# Patient Record
Sex: Female | Born: 1965
Health system: Southern US, Community
[De-identification: ages and names within clinical notes are randomized; demographics above are authoritative.]

## PROBLEM LIST (undated history)

## (undated) DIAGNOSIS — R896 Abnormal cytological findings in specimens from other organs, systems and tissues: Secondary | ICD-10-CM

## (undated) DIAGNOSIS — R011 Cardiac murmur, unspecified: Secondary | ICD-10-CM

## (undated) DIAGNOSIS — R51 Headache: Secondary | ICD-10-CM

## (undated) DIAGNOSIS — R519 Headache, unspecified: Secondary | ICD-10-CM

## (undated) DIAGNOSIS — M75100 Unspecified rotator cuff tear or rupture of unspecified shoulder, not specified as traumatic: Secondary | ICD-10-CM

## (undated) DIAGNOSIS — M1990 Unspecified osteoarthritis, unspecified site: Secondary | ICD-10-CM

## (undated) HISTORY — DX: Unspecified osteoarthritis, unspecified site: M19.90

## (undated) HISTORY — DX: Abnormal cytological findings in specimens from other organs, systems and tissues: R89.6

## (undated) HISTORY — DX: Unspecified rotator cuff tear or rupture of unspecified shoulder, not specified as traumatic: M75.100

## (undated) HISTORY — PX: TONSILLECTOMY AND ADENOIDECTOMY: SUR1326

## (undated) HISTORY — DX: Cardiac murmur, unspecified: R01.1

## (undated) HISTORY — DX: Headache: R51

## (undated) HISTORY — DX: Headache, unspecified: R51.9

---
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
## 2004-03-15 ENCOUNTER — Ambulatory Visit (HOSPITAL_COMMUNITY): Admission: RE | Admit: 2004-03-15 | Discharge: 2004-03-15 | Payer: Self-pay | Admitting: Internal Medicine

## 2005-02-24 ENCOUNTER — Other Ambulatory Visit: Admission: RE | Admit: 2005-02-24 | Discharge: 2005-02-24 | Payer: Self-pay | Admitting: Gynecology

## 2005-11-29 ENCOUNTER — Encounter: Admission: RE | Admit: 2005-11-29 | Discharge: 2005-11-29 | Payer: Self-pay | Admitting: Gynecology

## 2005-12-15 ENCOUNTER — Encounter: Admission: RE | Admit: 2005-12-15 | Discharge: 2005-12-15 | Payer: Self-pay | Admitting: Gynecology

## 2006-03-02 ENCOUNTER — Other Ambulatory Visit: Admission: RE | Admit: 2006-03-02 | Discharge: 2006-03-02 | Payer: Self-pay | Admitting: Gynecology

## 2006-03-27 HISTORY — PX: HYSTEROSCOPY: SHX211

## 2006-04-24 ENCOUNTER — Encounter (INDEPENDENT_AMBULATORY_CARE_PROVIDER_SITE_OTHER): Payer: Self-pay | Admitting: *Deleted

## 2006-04-24 ENCOUNTER — Ambulatory Visit (HOSPITAL_BASED_OUTPATIENT_CLINIC_OR_DEPARTMENT_OTHER): Admission: RE | Admit: 2006-04-24 | Discharge: 2006-04-24 | Payer: Self-pay | Admitting: Gynecology

## 2007-02-26 DIAGNOSIS — IMO0001 Reserved for inherently not codable concepts without codable children: Secondary | ICD-10-CM

## 2007-02-26 HISTORY — DX: Reserved for inherently not codable concepts without codable children: IMO0001

## 2007-03-08 ENCOUNTER — Other Ambulatory Visit: Admission: RE | Admit: 2007-03-08 | Discharge: 2007-03-08 | Payer: Self-pay | Admitting: Gynecology

## 2007-04-18 ENCOUNTER — Encounter: Admission: RE | Admit: 2007-04-18 | Discharge: 2007-04-18 | Payer: Self-pay | Admitting: Gynecology

## 2007-07-05 ENCOUNTER — Other Ambulatory Visit: Admission: RE | Admit: 2007-07-05 | Discharge: 2007-07-05 | Payer: Self-pay | Admitting: Gynecology

## 2008-05-06 ENCOUNTER — Encounter: Admission: RE | Admit: 2008-05-06 | Discharge: 2008-05-06 | Payer: Self-pay | Admitting: Gynecology

## 2009-06-29 ENCOUNTER — Encounter: Admission: RE | Admit: 2009-06-29 | Discharge: 2009-06-29 | Payer: Self-pay | Admitting: Gynecology

## 2009-07-30 ENCOUNTER — Other Ambulatory Visit: Admission: RE | Admit: 2009-07-30 | Discharge: 2009-07-30 | Payer: Self-pay | Admitting: Gynecology

## 2009-07-30 ENCOUNTER — Ambulatory Visit: Payer: Self-pay | Admitting: Women's Health

## 2010-07-18 ENCOUNTER — Encounter: Payer: Self-pay | Admitting: Gynecology

## 2010-08-10 ENCOUNTER — Other Ambulatory Visit (HOSPITAL_COMMUNITY)
Admission: RE | Admit: 2010-08-10 | Discharge: 2010-08-10 | Disposition: A | Discharge status: Home / Self Care | Payer: BC Managed Care – PPO | Source: Ambulatory Visit | Attending: Family Medicine | Admitting: Family Medicine

## 2010-08-10 ENCOUNTER — Other Ambulatory Visit: Payer: Self-pay | Admitting: Family Medicine

## 2010-08-10 DIAGNOSIS — Z0000 Encounter for general adult medical examination without abnormal findings: Secondary | ICD-10-CM | POA: Insufficient documentation

## 2010-11-12 NOTE — H&P (Signed)
NAME:  [name]               ACCOUNT NO.:  [id number]   MEDICAL RECORD NO.:  [id number]          PATIENT TYPE:  AMB   LOCATION:  [facility]                         FACILITY:  [facility]   PHYSICIAN:  Timothy P. Fontaine, M.D.DATE OF BIRTH:  1965-12-17   DATE OF ADMISSION:  04/24/2006  DATE OF DISCHARGE:                                HISTORY & PHYSICAL   CHIEF COMPLAINT:  Menorrhagia, endometrial polyp.   HISTORY OF PRESENT ILLNESS:  A 45 year old G2, P2 female, vasectomy birth  control, initially presented for her annual exam complaining of menorrhagia.  Her evaluation included a normal thyroid, normal prolactin and hemoglobin  11.8 and a sonohistogram which showed an echogenic posterior wall focus  measuring 32 x 7 mm.  The patient is admitted at this time for hysteroscopy,  removal of this defect D&C.   PAST MEDICAL HISTORY:  Uncomplicated.   PAST SURGICAL HISTORY:  1. Tonsillectomy.  2. Cesarean section.   ALLERGIES:  No medications.   CURRENT MEDICATIONS:  Ibuprofen p.r.n.   REVIEW OF SYSTEMS:  Noncontributory.   FAMILY HISTORY:  Noncontributory.   SOCIAL HISTORY:  Noncontributory.   ADMISSION PHYSICAL EXAM:  VITAL SIGNS:  Afebrile vital signs are stable.  HEENT: Normal.  LUNGS: Clear.  CARDIAC: Regular rate.  No rubs, murmurs, or gallops.  ABDOMEN:  Benign.  PELVIC:  External, BUS, vagina normal.  Cervix normal.  Uterus normal size,  midline, mobile, nontender, retroverted.  Adnexa without masses or  tenderness.   ASSESSMENT:  A 45 year old G2, P2 female, vasectomy birth control,  menorrhagia.  sonohistogram reveals an echogenic focus within the cavity  suggestive of a polyp.  I reviewed with the patient and her husband the  proposed surgery, expected intraoperative postoperative courses, long-term  short-term issues, and the acute risks.  I reviewed the use of the  resectoscope, hysteroscopy, D&C.  She understands there are no guarantees as  far as menorrhagia  relief, that her periods may continue to be heavier,  change, worsened following the procedure.  The risks of infection both  internal requiring prolonged antibiotics, postoperative requiring subsequent  antibiotics were reviewed.  The risk of hemorrhage necessitating transfusion  and the risks of transfusion including transfusion reaction, hepatitis, HIV,  Mad Cow disease, and other unknown entities was discussed, understood, and  accepted.  The risk of inadvertent injury to internal organs through direct  perforation or transuterine thermal effect, damaging bowel, bladder,  ureters, vessels, and nerves either immediately recognized or delay-  recognized necessitating major exploratory reparative surgeries, future  reparative surgeries including ostomy formation was all discussed,  understood, and accepted.  The use of the distended medium, possibilities of  absorption, risks of coma, seizures, and metabolic abnormalities subsequent  to this was all discussed, understood, and accepted.  Postoperative course  was reviewed and postoperative a.s.a.p. call precautions discussed.  She  understands again there are no guarantees as far as menstrual menorrhagia  relief and long-term management possibilities to include expectant  management, hormonal manipulation, endometrial ablation, and hysterectomy  were reviewed with her, and she is comfortable with proceeding with a  hysteroscopy now  and then monitoring her bleeding pattern afterwards.  The  patient's questions are answered to her satisfaction.  She is ready to  proceed with surgery.      Timothy P. Fontaine, M.D.  Electronically Signed     TPF/MEDQ  D:  04/19/2006  T:  04/20/2006  Job:  [id number]

## 2010-11-12 NOTE — Op Note (Signed)
NAME:  [name], [name]               ACCOUNT NO.:  [id number]   MEDICAL RECORD NO.:  [id number]          PATIENT TYPE:  AMB   LOCATION:  [facility]                         FACILITY:  [facility]   PHYSICIAN:  Timothy P. Fontaine, M.D.DATE OF BIRTH:  1965-07-31   DATE OF PROCEDURE:  04/24/2006  DATE OF DISCHARGE:                                 OPERATIVE REPORT   PREOPERATIVE DIAGNOSIS:  Endometrial polyp.   POSTOPERATIVE DIAGNOSIS:  Endometrial polyp.   PROCEDURE:  Hysteroscopic resection endometrial polyp, D&C.   SURGEON:  Timothy P. Fontaine, M.D.   ANESTHETIC:  General with 1% lidocaine paracervical block.   SPECIMEN:  1. Endometrial curetting  2. Endometrial polyp.   ESTIMATED BLOOD LOSS:  Minimal.   SORBITOL DISCREPANCY:  Minimal.   COMPLICATIONS:  None.   FINDINGS:  EUA external B U S, vagina normal.  Cervix is normal, uterus  retroverted, normal size, shape and midline mobile.  Adnexa without masses.  Hysteroscopic:  adequate noting fundus, anterior-posterior uterine surfaces,  lower uterine segment, endocervical canal, right and left tubal ostia all  visualized.  She had a classic endometrial polyp posterior lower endometrial  cavity excised in its entirety.  No other abnormalities visualized.   PROCEDURE:  The patient was taken to the operating room, underwent general  anesthesia was placed low dorsal lithotomy position, received a perineal  vaginal preparation with Betadine solution.  Bladder emptied with in-and-out  Foley catheterization noting scant urine.  EUA performed.  The patient  draped in the usual fashion.  Cervix was visualized with speculum, anterior  lip grasped with single-tooth tenaculum.  Paracervical block using 1%  lidocaine total of 10 mL was placed.  Cervix was then gradually gently  dilated to admit the operative hysteroscope.  Hysteroscopy was performed.  Using the right-angle resectoscopic loop, the polyp was excised at its base,  sent separately to  pathology.  A sharp curettage was performed and the  specimen was sent separately to pathology.  Rehysteroscopy showed a normal empty cavity, good distension, no evidence of  perforation.  The instruments were removed.  Adequate hemostasis visualized.  The patient placed in supine position, awakened without difficulty and taken  to recovery room in good condition having tolerated procedure well.      Timothy P. Fontaine, M.D.  Electronically Signed     TPF/MEDQ  D:  04/24/2006  T:  04/24/2006  Job:  [id number]

## 2011-08-30 ENCOUNTER — Other Ambulatory Visit: Payer: Self-pay | Admitting: Family Medicine

## 2011-08-30 DIAGNOSIS — R102 Pelvic and perineal pain: Secondary | ICD-10-CM

## 2011-09-01 ENCOUNTER — Ambulatory Visit
Admission: RE | Admit: 2011-09-01 | Discharge: 2011-09-01 | Disposition: A | Discharge status: Home / Self Care | Payer: BC Managed Care – PPO | Source: Ambulatory Visit | Attending: Family Medicine | Admitting: Family Medicine

## 2011-09-01 DIAGNOSIS — R102 Pelvic and perineal pain: Secondary | ICD-10-CM

## 2011-09-01 MED ORDER — IOHEXOL 300 MG/ML  SOLN: 100.0000 mL | Freq: Once | INTRAMUSCULAR | Status: AC | PRN

## 2011-09-15 ENCOUNTER — Encounter: Payer: Self-pay | Admitting: *Deleted

## 2011-09-16 ENCOUNTER — Ambulatory Visit (INDEPENDENT_AMBULATORY_CARE_PROVIDER_SITE_OTHER): Payer: BC Managed Care – PPO | Admitting: Gynecology

## 2011-09-16 ENCOUNTER — Encounter: Payer: Self-pay | Admitting: Gynecology

## 2011-09-16 ENCOUNTER — Other Ambulatory Visit (HOSPITAL_COMMUNITY)
Admission: RE | Admit: 2011-09-16 | Discharge: 2011-09-16 | Disposition: A | Discharge status: Home / Self Care | Payer: BC Managed Care – PPO | Source: Ambulatory Visit | Attending: Gynecology | Admitting: Gynecology

## 2011-09-16 VITALS — BP 120/76 | Ht 63.0 in | Wt 147.0 lb

## 2011-09-16 DIAGNOSIS — Z131 Encounter for screening for diabetes mellitus: Secondary | ICD-10-CM

## 2011-09-16 DIAGNOSIS — Z01419 Encounter for gynecological examination (general) (routine) without abnormal findings: Secondary | ICD-10-CM

## 2011-09-16 DIAGNOSIS — Z13220 Encounter for screening for lipoid disorders: Secondary | ICD-10-CM

## 2011-09-16 DIAGNOSIS — D259 Leiomyoma of uterus, unspecified: Secondary | ICD-10-CM

## 2011-09-16 DIAGNOSIS — N920 Excessive and frequent menstruation with regular cycle: Secondary | ICD-10-CM

## 2011-09-16 NOTE — Patient Instructions (Signed)
Follow up for ultrasound as scheduled 

## 2011-09-16 NOTE — Progress Notes (Signed)
[name] 10/30/1965 [id number]        46 y.o.  for annual exam.  Notes recently having been able to feel something in her abdomen discussed this with her primary care physician they ordered a CT scan which showed a multi-fibroid uterus but no other abnormalities. She is known to have leiomyoma on ultrasound done for her menorrhagia although they were small at that time.  Past medical history,surgical history, medications, allergies, family history and social history were all reviewed and documented in the EPIC chart. ROS:  Was performed and pertinent positives and negatives are included in the history.  Exam: [name] chaperone present Filed Vitals:   09/16/11 1404  BP: 120/76   General appearance  Normal Skin grossly normal Head/Neck normal with no cervical or supraclavicular adenopathy thyroid normal Lungs  clear Cardiac RR, without RMG Abdominal  soft, nontender, without masses, organomegaly or hernia Breasts  examined lying and sitting without masses, retractions, discharge or axillary adenopathy. Pelvic  Ext/BUS/vagina  normal   Cervix  normal  Pap done  Uterus  10 week size nodular, irregular with palpable pedunculated fibroid anteriorly, midline and mobile nontender   Adnexa  Without masses or tenderness    Anus and perineum  normal   Rectovaginal  normal sphincter tone without palpated masses or tenderness.    Assessment/Plan:  46 y.o. female for annual exam.    1. Palpable fibroids. Patient felt her fibroid in her lower abdomen suprapubically and this is what she felt when I have her feel herself during the exam. Her uterus is certainly enlarged since her last exam and ultrasound in 2007 which showed several small myomas the largest measuring 23 mm. Wouldn't start with sonohysterogram to better define the uterus and rule out adnexal disease. She still is having menorrhagia with 7 day flow, double protection and we will also assess for intracavitary abnormalities. She is  relatively asymptomatic and issues as to intervention versus observation were reviewed. I discussed in general observation, hysteroscopic myomectomy, endometrial ablation, Mirena IUD, myomectomy, hysterectomy. Patient will follow up for her sonohysterogram we'll go from there. 2. Mammography. Patient is overdue for mammogram now and knows to schedule this and agrees to do so. SBE monthly reviewed. 3. Pap smear. Patient does have history of ascus negative high-risk HPV 2008. Her last Pap was 2011 and I did a Pap smear today. She's only had 2 normal Pap smears since 2008. 4. Health maintenance. Baseline CBC liver profile glucose urinalysis were ordered. Patient will follow for sonohysterogram otherwise will follow up in one year for annual gynecologic exam.   Dara Lords MD, 4:53 PM 09/16/2011

## 2011-09-17 LAB — CBC WITH DIFFERENTIAL/PLATELET
Basophils Absolute: 0 10*3/uL (ref 0.0–0.1)
Basophils Relative: 0 % (ref 0–1)
Eosinophils Relative: 0 % (ref 0–5)
Lymphocytes Relative: 24 % (ref 12–46)
Lymphs Abs: 1.3 10*3/uL (ref 0.7–4.0)
MCV: 94.5 fL (ref 78.0–100.0)
Monocytes Absolute: 0.4 10*3/uL (ref 0.1–1.0)
Neutrophils Relative %: 69 % (ref 43–77)
Platelets: 211 10*3/uL (ref 150–400)
RBC: 3.99 MIL/uL (ref 3.87–5.11)
RDW: 13.7 % (ref 11.5–15.5)
WBC: 5.4 10*3/uL (ref 4.0–10.5)

## 2011-09-17 LAB — LIPID PANEL
HDL: 55 mg/dL (ref >39)
Total CHOL/HDL Ratio: 2.6 Ratio
Triglycerides: 99 mg/dL (ref <150)

## 2011-09-17 LAB — URINALYSIS W MICROSCOPIC + REFLEX CULTURE
Bacteria, UA: NONE SEEN
Casts: NONE SEEN
Crystals: NONE SEEN
Glucose, UA: NEGATIVE mg/dL
Ketones, ur: NEGATIVE mg/dL
Leukocytes, UA: NEGATIVE
Nitrite: NEGATIVE
Specific Gravity, Urine: <1.005 — ABNORMAL LOW (ref 1.005–1.030)
WBC, UA: NONE SEEN WBC/hpf (ref <3)
pH: 6.5 (ref 5.0–8.0)

## 2011-09-30 ENCOUNTER — Ambulatory Visit (INDEPENDENT_AMBULATORY_CARE_PROVIDER_SITE_OTHER): Payer: BC Managed Care – PPO | Admitting: Gynecology

## 2011-09-30 ENCOUNTER — Other Ambulatory Visit: Payer: Self-pay | Admitting: Gynecology

## 2011-09-30 ENCOUNTER — Encounter: Payer: Self-pay | Admitting: Gynecology

## 2011-09-30 ENCOUNTER — Ambulatory Visit (INDEPENDENT_AMBULATORY_CARE_PROVIDER_SITE_OTHER): Payer: BC Managed Care – PPO

## 2011-09-30 DIAGNOSIS — D259 Leiomyoma of uterus, unspecified: Secondary | ICD-10-CM

## 2011-09-30 DIAGNOSIS — N920 Excessive and frequent menstruation with regular cycle: Secondary | ICD-10-CM

## 2011-09-30 DIAGNOSIS — N949 Unspecified condition associated with female genital organs and menstrual cycle: Secondary | ICD-10-CM

## 2011-09-30 DIAGNOSIS — N938 Other specified abnormal uterine and vaginal bleeding: Secondary | ICD-10-CM

## 2011-09-30 NOTE — Progress Notes (Signed)
Patient presents for sonohysterogram due to with menorrhagia and history of leiomyoma.  Ultrasound shows endometrial echo is 4.7 mm. Multiple myomas are noted the largest measuring 31 mm. Right and left ovaries are visualized and normal. No cul-de-sac fluid noted. Sonohysterogram was performed, sterile technique, easy catheter introduction, good distention with no abnormality seen. Endometrial biopsy taken. Patient tolerated well.  On reexam bimanual uterus is irregular approximately 10 weeks size anteverted mobile.  Assessment and plan: Menorrhagia, leiomyoma. Patient will follow up for biopsy results. I discussed with her the options for management include observation, hormonal manipulation, Mirena IUD, endometrial ablation, uterine artery embolization, myomectomy, hysterectomy. The pros/cons, risks/benefits of each choice reviewed. Patient wants to think of her options and will follow up with [place] with her decision with the biopsy results

## 2011-09-30 NOTE — Patient Instructions (Signed)
Office will call you with the biopsy results. To give you options for his your heavy periods and follow up with this with your choice.

## 2011-10-05 ENCOUNTER — Telehealth: Payer: Self-pay

## 2011-10-05 NOTE — Telephone Encounter (Signed)
Message copied by Keenan Bachelor on Wed Oct 05, 2011  2:38 PM ------      Message from: Dara Lords      Created: Tue Oct 04, 2011 11:02 AM       Tell patient that the biopsy from her ultrasound was normal. Let us know what she is thinking as far as what she wants to do.

## 2011-10-05 NOTE — Telephone Encounter (Signed)
  Patient informed. She said what she decided was depending on biopsy and since it is normal she is going to observe. She said she should be going through menopause in the next few years and she is fine with it. Will just see you at her next visit.

## 2012-04-12 IMAGING — CT CT PELVIS W/ CM
2 of 3 series · 14 of 36 positions shown, 19 images · IV contrast (READICAT & [ID] OMNI 300)
Comparison: None.

CLINICAL DATA: Right groin pain, possible lump

CT PELVIS WITH CONTRAST
TECHNIQUE: Multidetector CT imaging of the pelvis was performed
using the standard protocol following the bolus administration of
intravenous contrast.
Contrast:   100 ml [id number]

[Series 601: coronal body · coronal · 0.70mm/px · 1 of 111 slices shown, 2 images]
[im 37/111  soft-tissue]
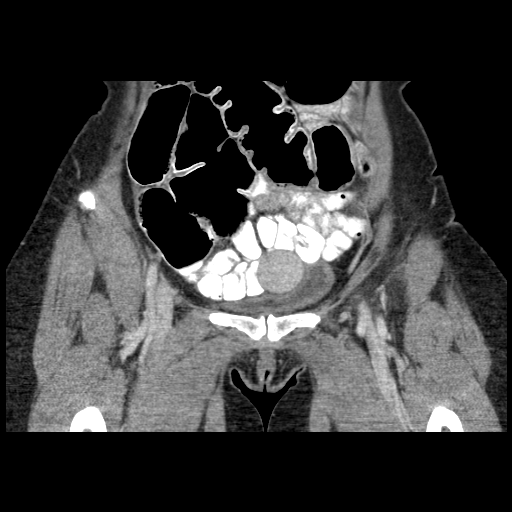
[im 37/111  bone]
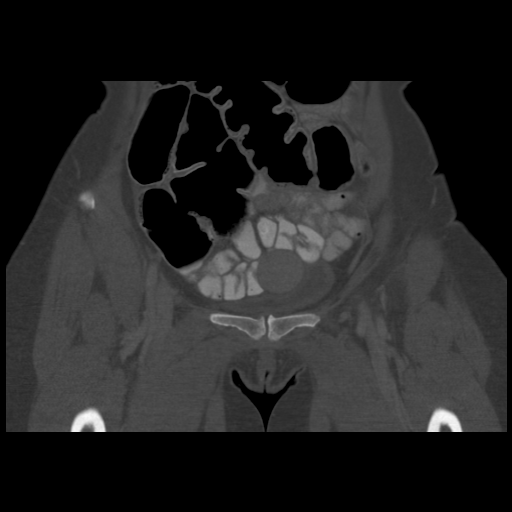

[Series 602: sagittal body · sagittal · 0.70mm/px · 13 of 145 slices shown, 17 images]
[im 6/145  lung]
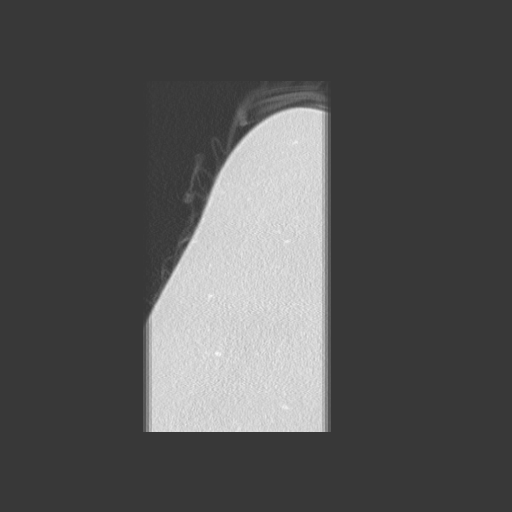
[im 12/145  soft-tissue]
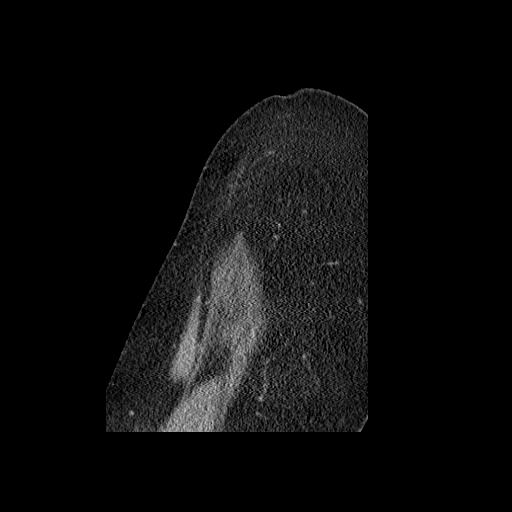
[im 12/145  lung]
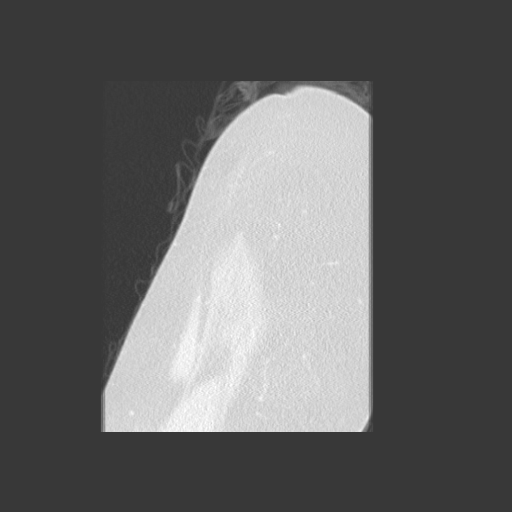
[im 12/145  bone]
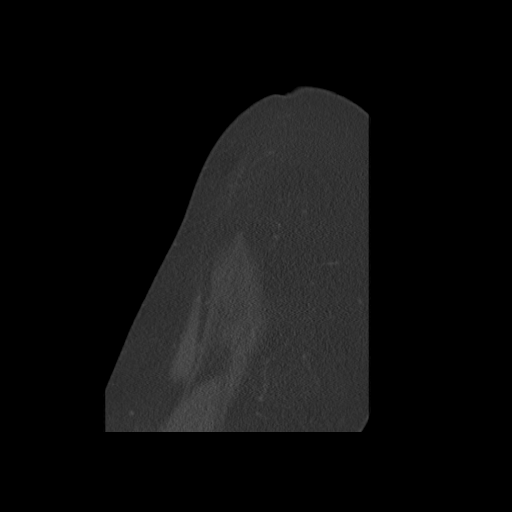
[im 18/145  lung]
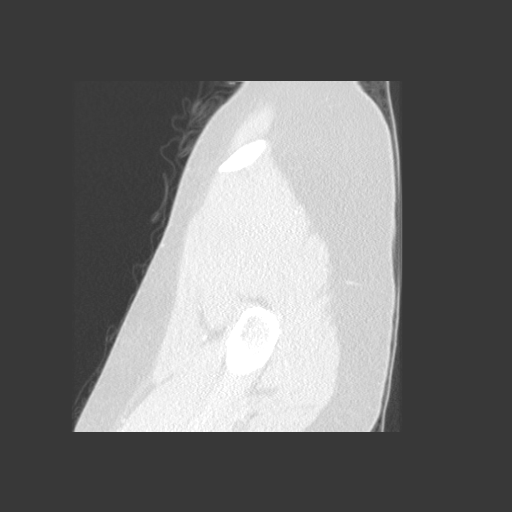
[im 24/145  soft-tissue]
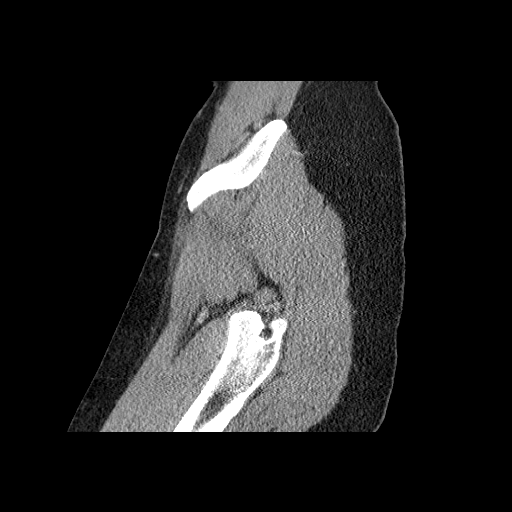
[im 24/145  lung]
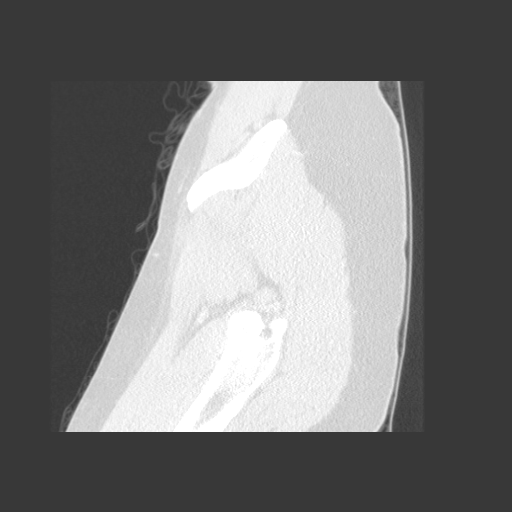
[im 35/145  soft-tissue]
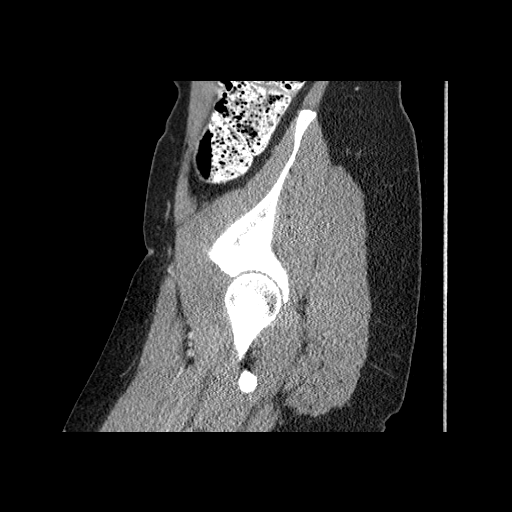
[im 47/145  soft-tissue]
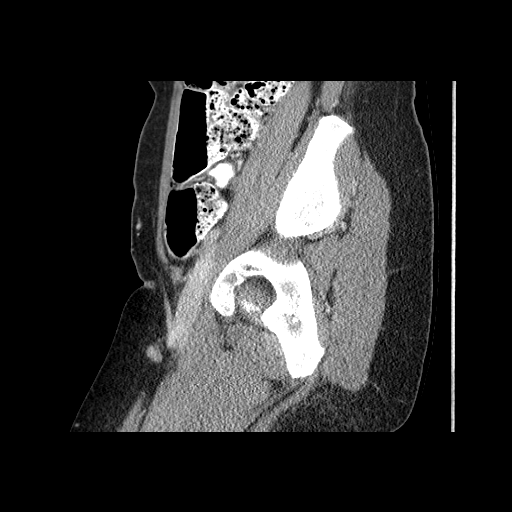
[im 58/145  soft-tissue]
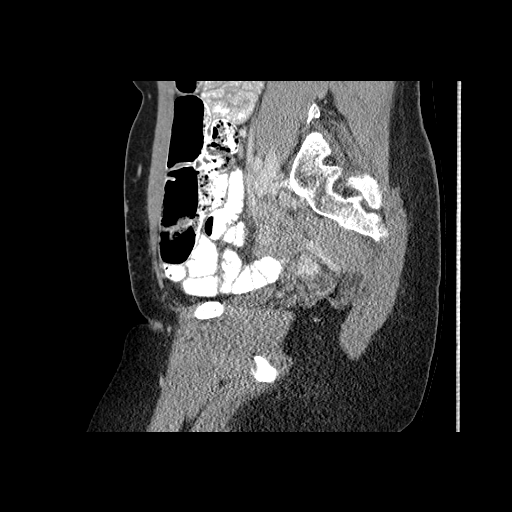
[im 75/145  soft-tissue]
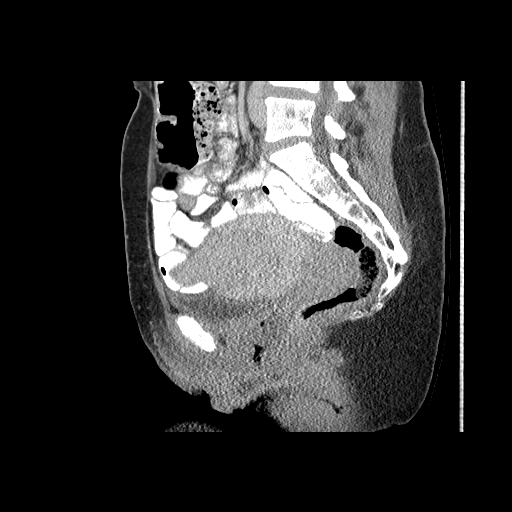
[im 87/145  soft-tissue]
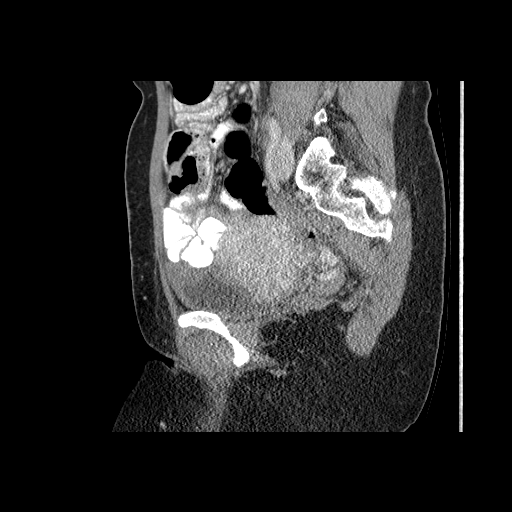
[im 98/145  soft-tissue]
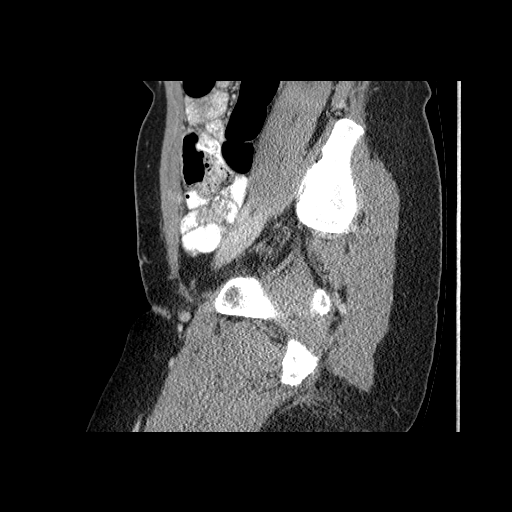
[im 110/145  soft-tissue]
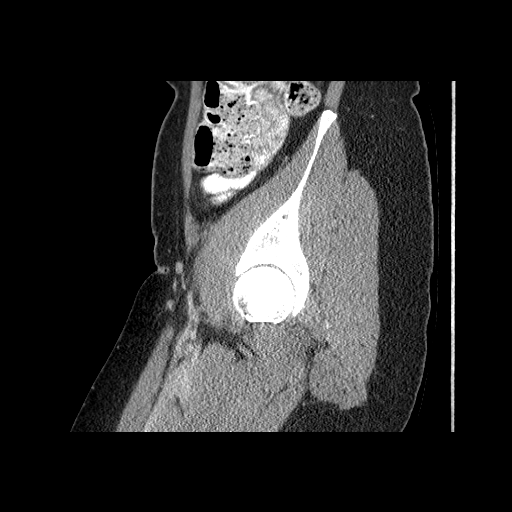
[im 110/145  bone]
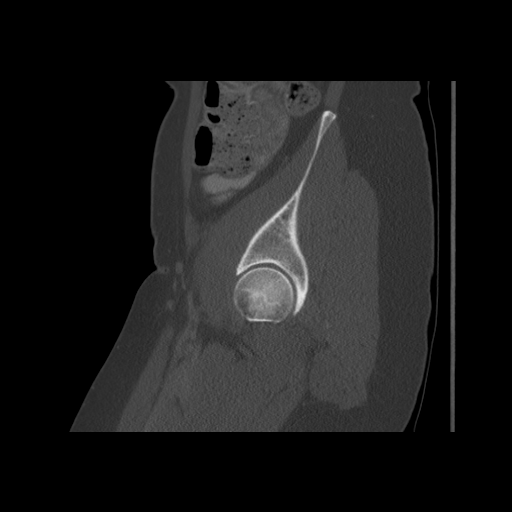
[im 121/145  soft-tissue]
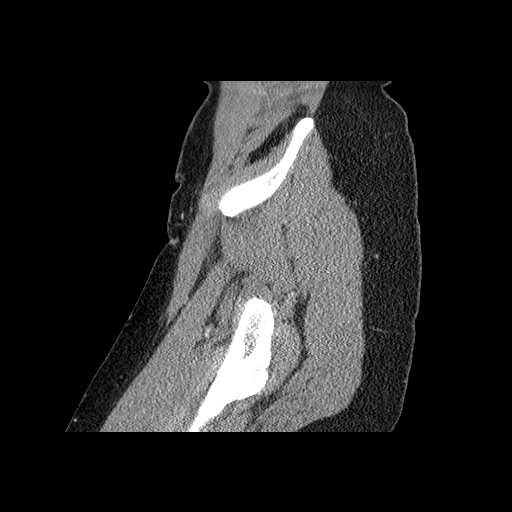
[im 133/145  soft-tissue]
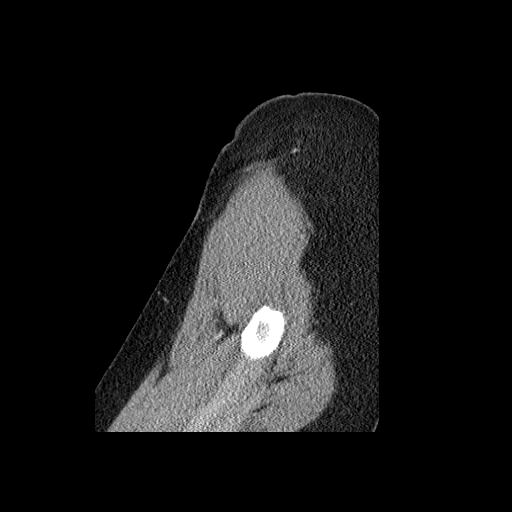

[14 of 36 positions shown; findings below may reference images not displayed]

FINDINGS: In the superficial soft tissues of the right lower
abdomen - right upper pelvis there is a probable sebaceous cyst
present. Small lymph nodes are present in the groin bilaterally but
no adenopathy or hernia is evident.  No vascular abnormality is
seen.  The uterus is lobular and enlarged consistent with multiple
fibroids.  Small right ovarian follicles are present and there is a
small amount of free fluid within the pelvis.  The urinary bladder
is somewhat compressed by the multifibroid uterus.  The terminal
ileum is unremarkable.  There does appear to be a tampon present
within the vagina.
IMPRESSION: 1.  No abnormality of the right groin is seen.  No hernia is noted
and no adenopathy is seen.
2.  Multifibroid uterus.
3.  Small right ovarian follicles and a small amount of free fluid
within the pelvis.

## 2012-11-12 ENCOUNTER — Other Ambulatory Visit: Payer: Self-pay

## 2012-11-12 DIAGNOSIS — Z1231 Encounter for screening mammogram for malignant neoplasm of breast: Secondary | ICD-10-CM

## 2012-11-29 ENCOUNTER — Ambulatory Visit
Admission: RE | Admit: 2012-11-29 | Discharge: 2012-11-29 | Disposition: A | Discharge status: Home / Self Care | Payer: BC Managed Care – PPO | Source: Ambulatory Visit

## 2012-11-29 DIAGNOSIS — Z1231 Encounter for screening mammogram for malignant neoplasm of breast: Secondary | ICD-10-CM

## 2013-10-31 ENCOUNTER — Encounter (INDEPENDENT_AMBULATORY_CARE_PROVIDER_SITE_OTHER): Payer: Self-pay

## 2013-10-31 ENCOUNTER — Encounter: Payer: Self-pay | Admitting: Neurology

## 2013-10-31 ENCOUNTER — Ambulatory Visit (INDEPENDENT_AMBULATORY_CARE_PROVIDER_SITE_OTHER): Payer: BC Managed Care – PPO | Admitting: Neurology

## 2013-10-31 VITALS — BP 117/66 | HR 74 | Ht 64.0 in | Wt 154.0 lb

## 2013-10-31 DIAGNOSIS — H5703 Miosis: Secondary | ICD-10-CM

## 2013-10-31 DIAGNOSIS — R51 Headache: Secondary | ICD-10-CM

## 2013-10-31 DIAGNOSIS — R519 Headache, unspecified: Secondary | ICD-10-CM | POA: Insufficient documentation

## 2013-10-31 DIAGNOSIS — G8929 Other chronic pain: Secondary | ICD-10-CM | POA: Insufficient documentation

## 2013-10-31 NOTE — Progress Notes (Signed)
[facility]    Provider:  Dr Janann Colonel Referring Provider: Reginia Naas, * Primary Care Physician:  Florina Ou, MD  CC:  Headache and small pupils  HPI:  [name] is a 48 y.o. female here as a referral from Dr. Tamala Julian for headache and constricted pupils. Initially noted constricted pupils around 4 months ago though headaches have been longer. She initially noted a pupil change after having difficulty seeing things in bright light, had trouble focusing. She saw an eye doctor who told her that her eyes were small, she reports they were able to dilate them though they did not dilate fully. Recently had a brain MRI without contrast, states that it was unremarkable.(Images not available but read report from [place]) Denies any double vision, just a continued difficulty with focusing. Feels this is worse in bright light, especially under fluorescent light.   Headache is left frontal, dull pressure type pain. At worst gets up to a 5/10. Wakes up headache free and then develops symptoms as the day goes on. + Nausea with the headache. No focal motor or sensory symptoms. She reports history of chronic neck and shoulder pain on the left. No history of trauma to the neck.   Otherwise healthy.   Review of Systems: Out of a complete 14 system review, the patient complains of only the following symptoms, and all other reviewed systems are negative. + numbness  History   Social History  . Marital Status: Married    Spouse Name: N/A    Number of Children: N/A  . Years of Education: N/A   Occupational History  . Not on file.   Social History Main Topics  . Smoking status: Never Smoker   . Smokeless tobacco: Never Used  . Alcohol Use: Yes     Comment: OCCASSIONALLY  . Drug Use: No  . Sexual Activity: Yes    Birth Control/ Protection: Other-see comments     Comment: VASECTOMY   Other Topics Concern  . Not on file   Social History Narrative   Married 2 children    Right handed   Currently in graduate school   3 cups daily    Family History  Problem Relation Age of Onset  . Hypertension Mother   . Breast cancer Maternal Grandmother     Past Medical History  Diagnosis Date  . ASCUS on Pap smear 02/2007    NEG HPV  . Heart murmur     Past Surgical History  Procedure Laterality Date  . Hysteroscopy  03/2006    WITH D&C  . Cesarean section    . Tonsillectomy and adenoidectomy      Current Outpatient Prescriptions  Medication Sig Dispense Refill  . Multiple Vitamin (MULTIVITAMIN) tablet Take 1 tablet by mouth daily.       No current facility-administered medications for this visit.    Allergies as of 10/31/2013  . (No Known Allergies)    Vitals: BP 117/66  Pulse 74  Ht 5\' 4"  (1.626 m)  Wt 154 lb (69.854 kg)  BMI 26.42 kg/m2 Last Weight:  Wt Readings from Last 1 Encounters:  10/31/13 154 lb (69.854 kg)   Last Height:   Ht Readings from Last 1 Encounters:  10/31/13 5\' 4"  (1.626 m)     Physical exam: Exam: Gen: NAD, conversant Eyes: anicteric sclerae, moist conjunctivae HENT: Atraumatic, oropharynx clear Neck: Trachea midline; supple,  Lungs: CTA, no wheezing, rales, rhonic  CV: RRR, no MRG Abdomen: Soft, non-tender;  Extremities: No peripheral edema  Skin: Normal temperature, no rash,  Psych: Appropriate affect, pleasant  Neuro: MS: AA&Ox3, appropriately interactive, normal affect   Speech: fluent w/o paraphasic error  Memory: good recent and remote recall  CN: Pupils small (26mm) but briskly reactive to 18mm, VFF to FC bilat, EOMI no nystagmus, no ptosis, sensation intact to LT V1-V3 bilat, face symmetric, no weakness, hearing grossly intact, palate elevates symmetrically, shoulder shrug 5/5 bilat,  tongue protrudes midline, no fasiculations noted.  Motor: normal bulk and tone Strength: 5/5  In all extremities  Coord: rapid alternating and point-to-point (FNF, HTS) movements  intact.  Reflexes: symmetrical, bilat downgoing toes  Sens: LT intact in all extremities  Gait: posture, stance, stride and arm-swing normal. Tandem gait intact. Able to walk on heels and toes. Romberg absent.   Assessment:  After physical and neurologic examination, review of laboratory studies, imaging, neurophysiology testing and pre-existing records, assessment will be reviewed on the problem list.  Plan:  Treatment plan and additional workup will be reviewed under Problem List.  1)Headache 2)Miotic pupils  48y/o woman presenting for initial evaluation of chronic daily headache and miotic pupils. Unclear etiology of symptoms. Had recent MRI brain without contrast which was unremarkable. Due to bilateral nature of symptoms this would be atypical for a Horner's syndrome. Will check MRI brain w/ contrast to rule out a structural lesion, specifically in the brain stem. Will check MRA neck vessels to check for possible vascular cause, though atypical as symptoms are bilateral. Pending results can check CXR to rule out Pancoast tumor. Patient will have report from eye doctor faxed to our office. Follow up once MRI completed.   Jim Like, DO  [facility] [address] [place], [place] [address]  Phone [phone] Fax [phone]

## 2013-10-31 NOTE — Patient Instructions (Signed)
Overall you are doing fairly well but I do want to suggest a few things today:   As far as diagnostic testing:  1)I would like you to have a MRI with contrast and a MRA of the vessels of your neck  Please have your eye doctors report faxed to our office  Follow up once MRI completed.  Please call us with any interim questions, concerns, problems, updates or refill requests.   My clinical assistant and will answer any of your questions and relay your messages to me and also relay most of my messages to you.   Our phone number is [phone]. We also have an after hours call service for urgent matters and there is a physician on-call for urgent questions. For any emergencies you know to call 911 or go to the nearest emergency room

## 2013-11-13 ENCOUNTER — Ambulatory Visit (INDEPENDENT_AMBULATORY_CARE_PROVIDER_SITE_OTHER): Payer: BC Managed Care – PPO

## 2013-11-13 DIAGNOSIS — R519 Headache, unspecified: Secondary | ICD-10-CM

## 2013-11-13 DIAGNOSIS — R51 Headache: Secondary | ICD-10-CM

## 2013-11-13 DIAGNOSIS — H5703 Miosis: Secondary | ICD-10-CM

## 2013-11-13 MED ORDER — GADOPENTETATE DIMEGLUMINE 469.01 MG/ML IV SOLN: 20.0000 mL | Freq: Once | INTRAVENOUS | Status: AC | PRN

## 2013-11-15 ENCOUNTER — Other Ambulatory Visit: Payer: Self-pay | Admitting: Neurology

## 2013-11-15 DIAGNOSIS — H539 Unspecified visual disturbance: Secondary | ICD-10-CM

## 2013-11-15 DIAGNOSIS — R51 Headache: Secondary | ICD-10-CM

## 2013-11-25 ENCOUNTER — Telehealth: Payer: Self-pay | Admitting: Neurology

## 2013-11-25 NOTE — Telephone Encounter (Signed)
Called pt to inform her that the referral to a neuro ophthalmologist has been put in and that pt is just wanted for [facility] to give her a call. Pt wanted that number and I gave the pt the number so she could contact them. I advised the pt the if she has any other problems, questions or concerns to call the office. Pt verbalized understanding.

## 2013-11-25 NOTE — Telephone Encounter (Signed)
Patient states that Dr. Janann Colonel was going to refer her to a neuro ophthalmologist and it has been 3 weeks and she has not heard anything back. Please call to advise.

## 2013-12-26 ENCOUNTER — Telehealth: Payer: Self-pay | Admitting: Neurology

## 2013-12-26 NOTE — Telephone Encounter (Signed)
Patient requesting Neuro Ophthalmologist referral sent to [facility], due to [facility] can't see her until Sept 22 nd.  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  Also questioning if MRI with Contrast of the neck consisted of viewing lung area also.  Please call and advise.

## 2013-12-30 NOTE — Telephone Encounter (Signed)
Pt's information was given to [place], [place].

## 2013-12-31 ENCOUNTER — Other Ambulatory Visit: Payer: Self-pay

## 2013-12-31 DIAGNOSIS — Z1231 Encounter for screening mammogram for malignant neoplasm of breast: Secondary | ICD-10-CM

## 2014-01-03 NOTE — Telephone Encounter (Signed)
Spoke with patient, referral for [facility] neuro-opthalmology has been shredded, patient will keep appointment at [facility] in September.

## 2014-01-03 NOTE — Telephone Encounter (Signed)
Patient called back and said dont worry about faxing the referral to [facility]. She called them and they have no openings until October and wouldn't even schedule her with a faxed referral anyway.  They have to speak directly to the MD with the referral.  She will just stay with the [facility] appt in Sept.

## 2014-01-09 ENCOUNTER — Encounter (INDEPENDENT_AMBULATORY_CARE_PROVIDER_SITE_OTHER): Payer: Self-pay

## 2014-01-09 ENCOUNTER — Ambulatory Visit
Admission: RE | Admit: 2014-01-09 | Discharge: 2014-01-09 | Disposition: A | Discharge status: Home / Self Care | Payer: BC Managed Care – PPO | Source: Ambulatory Visit

## 2014-01-09 DIAGNOSIS — Z1231 Encounter for screening mammogram for malignant neoplasm of breast: Secondary | ICD-10-CM

## 2014-01-17 ENCOUNTER — Telehealth: Payer: Self-pay | Admitting: Neurology

## 2014-01-17 NOTE — Telephone Encounter (Signed)
Patient calling to ask if her MRI with contrast shows the top part of her lungs, she wants to rule out whether it is a Paneoast (?) tumor. Please return call to patient and advise.

## 2014-01-20 ENCOUNTER — Other Ambulatory Visit: Payer: Self-pay | Admitting: Neurology

## 2014-01-20 DIAGNOSIS — G902 Horner's syndrome: Secondary | ICD-10-CM

## 2014-01-20 NOTE — Telephone Encounter (Signed)
This is from Friday, please advise.

## 2014-01-20 NOTE — Telephone Encounter (Signed)
Please let her know I ordered a chest x-ray, this will rule out a pancoast tumor. She can stop by [facility] imaging to complete this.

## 2014-01-21 NOTE — Telephone Encounter (Signed)
Called to share a message with patient from Dr Hazle Quant telephone note, lt VM message

## 2014-01-21 NOTE — Telephone Encounter (Signed)
Called and shared message from Dr Janann Colonel, with patient, she verbalized understanding

## 2014-01-24 ENCOUNTER — Telehealth: Payer: Self-pay | Admitting: *Deleted

## 2014-01-24 ENCOUNTER — Ambulatory Visit
Admission: RE | Admit: 2014-01-24 | Discharge: 2014-01-24 | Disposition: A | Discharge status: Home / Self Care | Payer: BC Managed Care – PPO | Source: Ambulatory Visit | Attending: Neurology | Admitting: Neurology

## 2014-01-24 DIAGNOSIS — G902 Horner's syndrome: Secondary | ICD-10-CM

## 2014-01-24 NOTE — Telephone Encounter (Signed)
Message copied by Vivi Barrack on Fri Jan 24, 2014  4:30 PM ------      Message from: Drema Dallas      Created: Fri Jan 24, 2014  4:07 PM       Please let her know her x-ray was unremarkable. ------

## 2014-01-24 NOTE — Telephone Encounter (Signed)
Spoke to patient and she is aware of X-ray results.

## 2014-04-25 ENCOUNTER — Telehealth: Payer: Self-pay | Admitting: Neurology

## 2014-04-25 NOTE — Telephone Encounter (Signed)
Called patient and left message regarding scheduling an follow up appointment with Dr. Jaynee Eagles regarding Headaches (previous [place] [facility]).

## 2014-04-28 ENCOUNTER — Encounter: Payer: Self-pay | Admitting: Neurology

## 2016-02-17 LAB — HM PAP SMEAR: HM Pap smear: NEGATIVE

## 2016-08-19 ENCOUNTER — Other Ambulatory Visit: Payer: Self-pay | Admitting: Family Medicine

## 2016-08-19 DIAGNOSIS — D259 Leiomyoma of uterus, unspecified: Secondary | ICD-10-CM

## 2016-08-23 ENCOUNTER — Other Ambulatory Visit: Payer: Self-pay

## 2016-08-30 ENCOUNTER — Ambulatory Visit
Admission: RE | Admit: 2016-08-30 | Discharge: 2016-08-30 | Disposition: A | Discharge status: Home / Self Care | Payer: BC Managed Care – PPO | Source: Ambulatory Visit | Attending: Family Medicine | Admitting: Family Medicine

## 2016-08-30 DIAGNOSIS — D259 Leiomyoma of uterus, unspecified: Secondary | ICD-10-CM

## 2017-11-27 IMAGING — US US TRANSVAGINAL NON-OB
1 series · 13 of 25 positions shown · non-contrast
Comparison: Pelvic CT scan dated September 01, 2011.

CLINICAL DATA: Uterine fibroids demonstrated on CT scan September 01, 2011 ; left lower quadrant pain for the past 6-7 months, heavy
cycles. History of previous Cesarean section. Onset of last normal
menstrual period was August 25, 2016.



[Series 1: us transvaginal non-ob · 0.30mm/px · 13 of 94 slices shown]
[im 1/94]
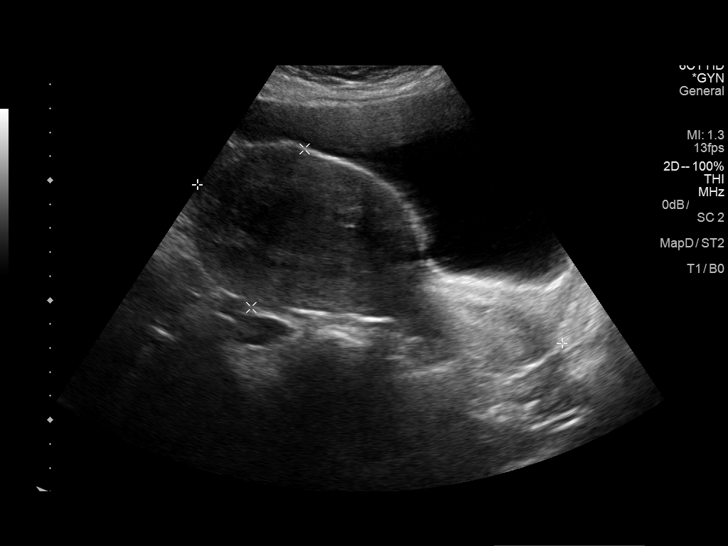
[im 8/94]
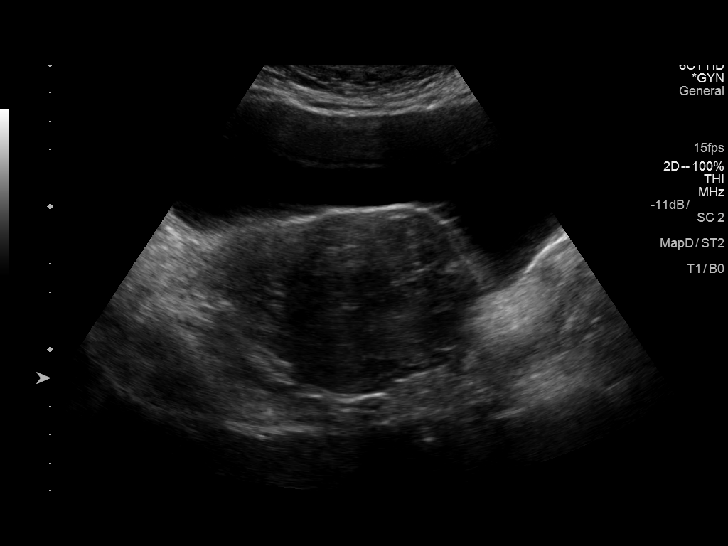
[im 16/94]
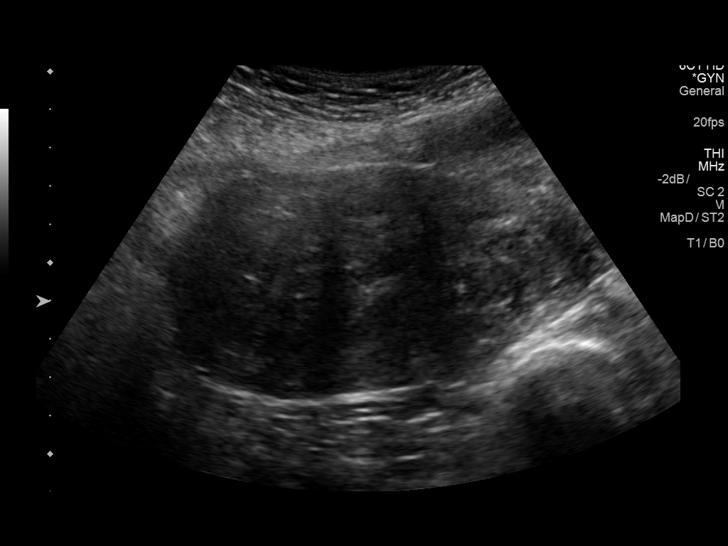
[im 24/94]
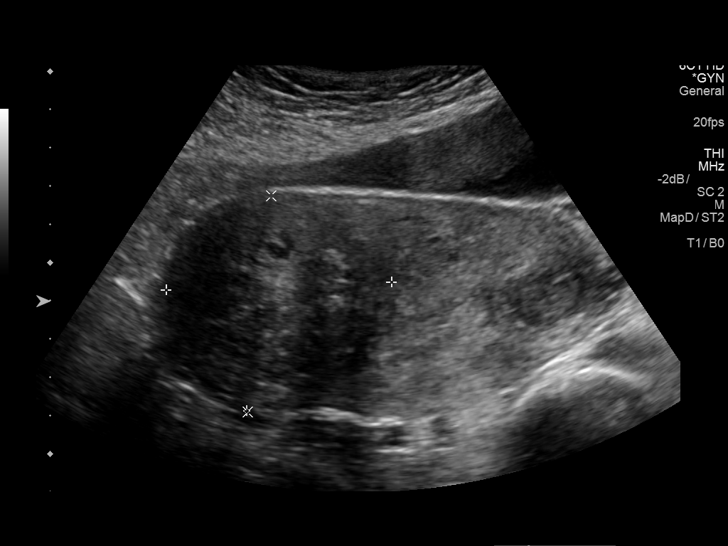
[im 32/94]
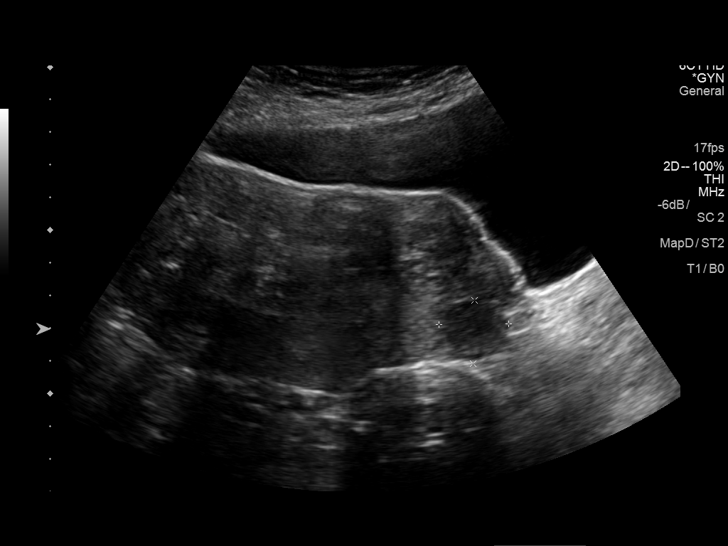
[im 39/94]
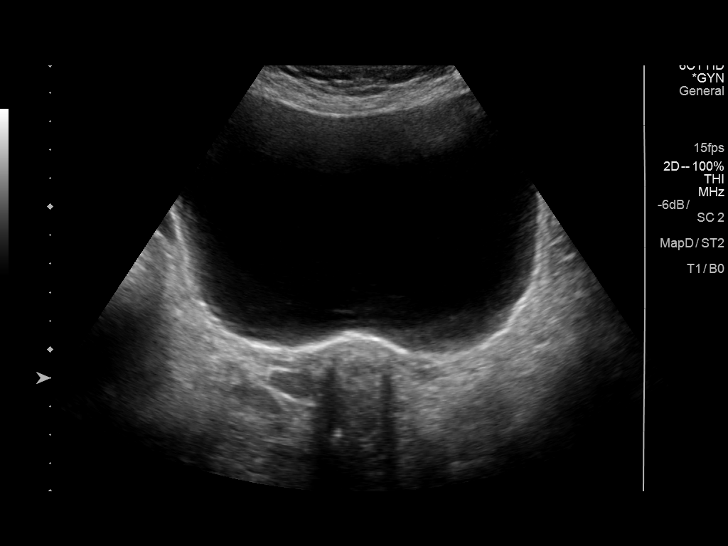
[im 47/94]
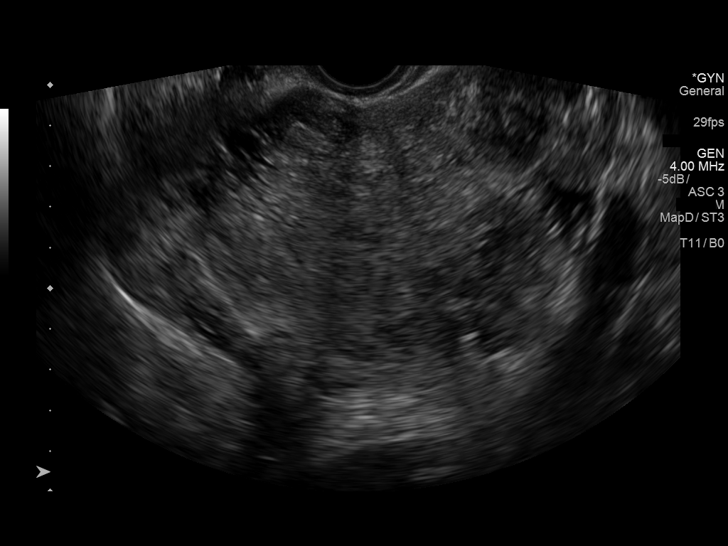
[im 55/94]
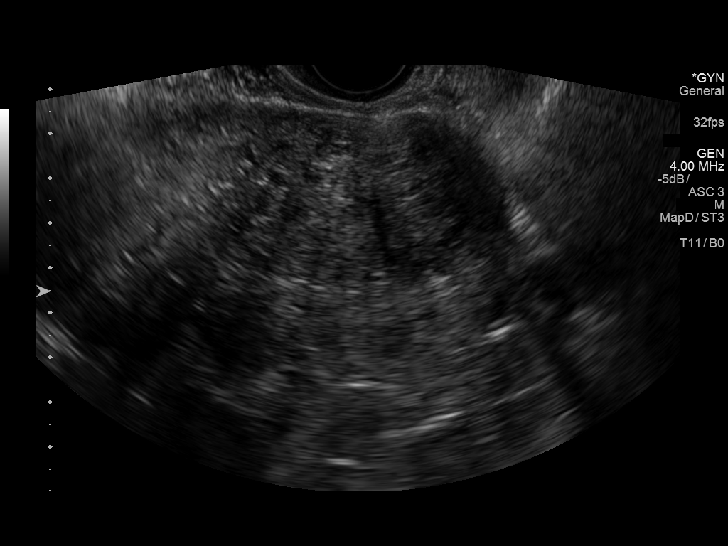
[im 63/94]
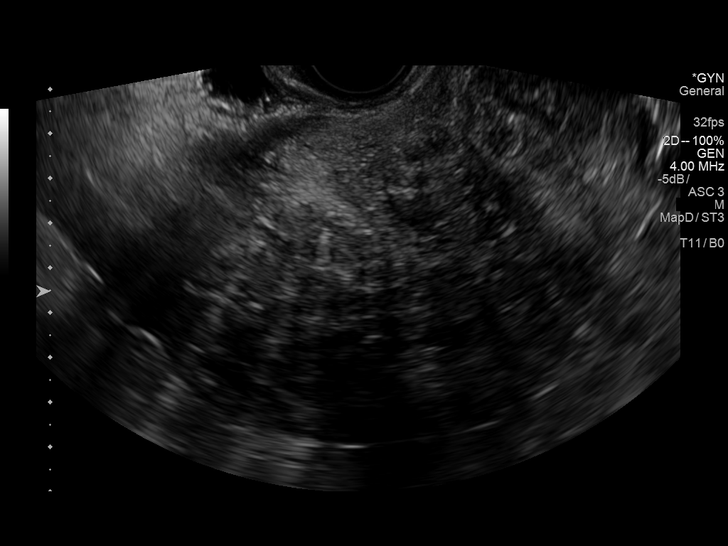
[im 70/94]
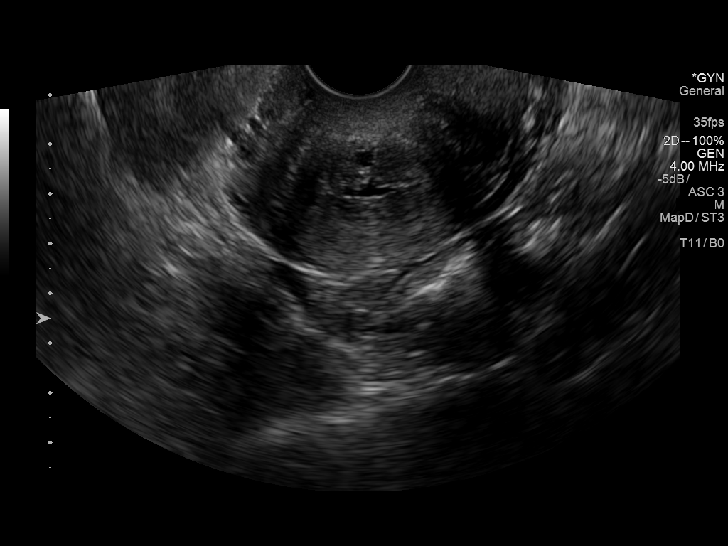
[im 78/94]
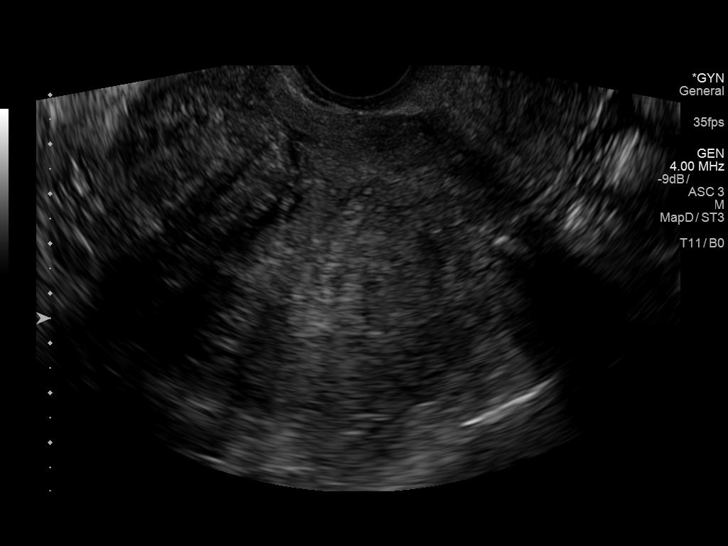
[im 86/94]
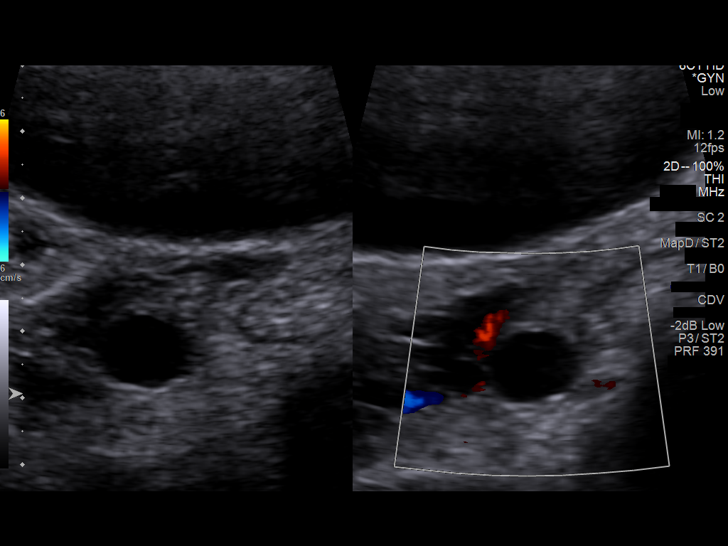
[im 94/94]
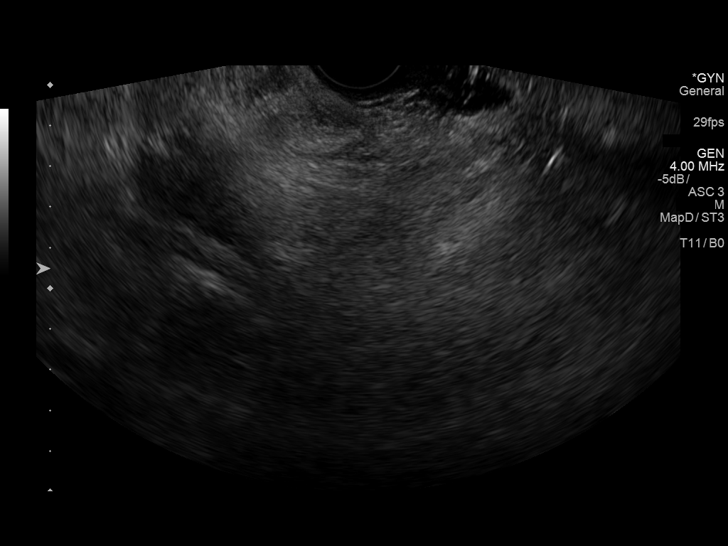

[13 of 25 positions shown; findings below may reference images not displayed]

FINDINGS: Uterus

Measurements: 17.0 x 7.0 x 12.0 cm. Multiple uterine fibroids are
observed. On the right a pedunculated fibroid arising from the
fundus measures 4 4 cm in diameter. Intramural fibroids in the
fundus and left aspect of the corpus measure 3.2 and 2.6 cm
respectively. A ease sub endometrial fibroid on the left measures
5.4 cm in greatest dimension. An intramural fibroid in the right
uterine corpus measures 6 cm in greatest dimension. A fibroid in the
left mid to lower uterine segment measures 3.1 cm greatest
dimension.

Endometrium

Thickness: 8.6 mm. No endometrial mass or fluid collection is
observed.

Right ovary

The right ovary could not be visualized.

Left ovary

Measurements: 2.7 x 2.0 x 4.3 cm.. There are multiple developing
follicles in the left ovary with the largest measuring 1.5 cm in
greatest dimension.

Other findings

There is a trace of free pelvic fluid which is likely physiologic.
IMPRESSION: Enlarged uterus demonstrating multiple fibroids with the largest
measuring 6 x 5.7 x 5.8 cm. The endometrial stripe is not abnormally
thickened.

Nonvisualization of the right ovary. Normal appearance of the left
ovary.

## 2018-02-23 LAB — HM MAMMOGRAPHY

## 2018-08-15 ENCOUNTER — Ambulatory Visit (INDEPENDENT_AMBULATORY_CARE_PROVIDER_SITE_OTHER): Payer: BC Managed Care – PPO | Admitting: Nurse Practitioner

## 2018-08-15 ENCOUNTER — Encounter: Payer: Self-pay | Admitting: Nurse Practitioner

## 2018-08-15 ENCOUNTER — Encounter (INDEPENDENT_AMBULATORY_CARE_PROVIDER_SITE_OTHER): Payer: Self-pay

## 2018-08-15 VITALS — BP 120/90 | HR 67 | Ht 62.75 in | Wt 162.0 lb

## 2018-08-15 DIAGNOSIS — Z13220 Encounter for screening for lipoid disorders: Secondary | ICD-10-CM | POA: Diagnosis not present

## 2018-08-15 DIAGNOSIS — Z0000 Encounter for general adult medical examination without abnormal findings: Secondary | ICD-10-CM | POA: Diagnosis not present

## 2018-08-15 DIAGNOSIS — Z1159 Encounter for screening for other viral diseases: Secondary | ICD-10-CM

## 2018-08-15 DIAGNOSIS — Z9189 Other specified personal risk factors, not elsewhere classified: Secondary | ICD-10-CM

## 2018-08-15 NOTE — Assessment & Plan Note (Signed)
Reviewed annual screening exams, healthy lifestyle, additional information provided on AVS We will fax her GYN for records She will look for colonoscopy records at home - Comprehensive metabolic panel; Future - CBC; Future - Lipid panel; Future - Hepatitis C antibody; Future  Screening for cholesterol level- Lipid panel; Future  Encounter for hepatitis C virus screening test for high risk patient- Hepatitis C antibody; Future

## 2018-08-15 NOTE — Patient Instructions (Signed)
Please return for labs when fasting, only water or black coffee for 6 hours prior  Please bring [place] your colonoscopy records by if you can find them  It was very nice to meet!  Health Maintenance, Female Adopting a healthy lifestyle and getting preventive care can go a long way to promote health and wellness. Talk with your health care provider about what schedule of regular examinations is right for you. This is a good chance for you to check in with your provider about disease prevention and staying healthy. In between checkups, there are plenty of things you can do on your own. Experts have done a lot of research about which lifestyle changes and preventive measures are most likely to keep you healthy. Ask your health care provider for more information. Weight and diet Eat a healthy diet  Be sure to include plenty of vegetables, fruits, low-fat dairy products, and lean protein.  Do not eat a lot of foods high in solid fats, added sugars, or salt.  Get regular exercise. This is one of the most important things you can do for your health. ? Most adults should exercise for at least 150 minutes each week. The exercise should increase your heart rate and make you sweat (moderate-intensity exercise). ? Most adults should also do strengthening exercises at least twice a week. This is in addition to the moderate-intensity exercise. Maintain a healthy weight  Body mass index (BMI) is a measurement that can be used to identify possible weight problems. It estimates body fat based on height and weight. Your health care provider can help determine your BMI and help you achieve or maintain a healthy weight.  For females 61 years of age and older: ? A BMI below 18.5 is considered underweight. ? A BMI of 18.5 to 24.9 is normal. ? A BMI of 25 to 29.9 is considered overweight. ? A BMI of 30 and above is considered obese. Watch levels of cholesterol and blood lipids  You should start having your blood  tested for lipids and cholesterol at 55 years of age, then have this test every 5 years.  You may need to have your cholesterol levels checked more often if: ? Your lipid or cholesterol levels are high. ? You are older than 55 years of age. ? You are at high risk for heart disease. Cancer screening Lung Cancer  Lung cancer screening is recommended for adults 22-50 years old who are at high risk for lung cancer because of a history of smoking.  A yearly low-dose CT scan of the lungs is recommended for people who: ? Currently smoke. ? Have quit within the past 15 years. ? Have at least a 30-pack-year history of smoking. A pack year is smoking an average of one pack of cigarettes a day for 1 year.  Yearly screening should continue until it has been 15 years since you quit.  Yearly screening should stop if you develop a health problem that would prevent you from having lung cancer treatment. Breast Cancer  Practice breast self-awareness. This means understanding how your breasts normally appear and feel.  It also means doing regular breast self-exams. Let your health care provider know about any changes, no matter how small.  If you are in your 20s or 30s, you should have a clinical breast exam (CBE) by a health care provider every 1-3 years as part of a regular health exam.  If you are 8 or older, have a CBE every year. Also consider having  a breast X-ray (mammogram) every year.  If you have a family history of breast cancer, talk to your health care provider about genetic screening.  If you are at high risk for breast cancer, talk to your health care provider about having an MRI and a mammogram every year.  Breast cancer gene (BRCA) assessment is recommended for women who have family members with BRCA-related cancers. BRCA-related cancers include: ? Breast. ? Ovarian. ? Tubal. ? Peritoneal cancers.  Results of the assessment will determine the need for genetic counseling and  BRCA1 and BRCA2 testing. Cervical Cancer Your health care provider may recommend that you be screened regularly for cancer of the pelvic organs (ovaries, uterus, and vagina). This screening involves a pelvic examination, including checking for microscopic changes to the surface of your cervix (Pap test). You may be encouraged to have this screening done every 3 years, beginning at age 81.  For women ages 58-65, health care providers may recommend pelvic exams and Pap testing every 3 years, or they may recommend the Pap and pelvic exam, combined with testing for human papilloma virus (HPV), every 5 years. Some types of HPV increase your risk of cervical cancer. Testing for HPV may also be done on women of any age with unclear Pap test results.  Other health care providers may not recommend any screening for nonpregnant women who are considered low risk for pelvic cancer and who do not have symptoms. Ask your health care provider if a screening pelvic exam is right for you.  If you have had past treatment for cervical cancer or a condition that could lead to cancer, you need Pap tests and screening for cancer for at least 20 years after your treatment. If Pap tests have been discontinued, your risk factors (such as having a new sexual partner) need to be reassessed to determine if screening should resume. Some women have medical problems that increase the chance of getting cervical cancer. In these cases, your health care provider may recommend more frequent screening and Pap tests. Colorectal Cancer  This type of cancer can be detected and often prevented.  Routine colorectal cancer screening usually begins at 55 years of age and continues through 55 years of age.  Your health care provider may recommend screening at an earlier age if you have risk factors for colon cancer.  Your health care provider may also recommend using home test kits to check for hidden blood in the stool.  A small camera at  the end of a tube can be used to examine your colon directly (sigmoidoscopy or colonoscopy). This is done to check for the earliest forms of colorectal cancer.  Routine screening usually begins at age 22.  Direct examination of the colon should be repeated every 5-10 years through 55 years of age. However, you may need to be screened more often if early forms of precancerous polyps or small growths are found. Skin Cancer  Check your skin from head to toe regularly.  Tell your health care provider about any new moles or changes in moles, especially if there is a change in a mole's shape or color.  Also tell your health care provider if you have a mole that is larger than the size of a pencil eraser.  Always use sunscreen. Apply sunscreen liberally and repeatedly throughout the day.  Protect yourself by wearing long sleeves, pants, a wide-brimmed hat, and sunglasses whenever you are outside. Heart disease, diabetes, and high blood pressure  High blood pressure causes  heart disease and increases the risk of stroke. High blood pressure is more likely to develop in: ? People who have blood pressure in the high end of the normal range (130-139/85-89 mm Hg). ? People who are overweight or obese. ? People who are African American.  If you are 42-24 years of age, have your blood pressure checked every 3-5 years. If you are 51 years of age or older, have your blood pressure checked every year. You should have your blood pressure measured twice-once when you are at a hospital or clinic, and once when you are not at a hospital or clinic. Record the average of the two measurements. To check your blood pressure when you are not at a hospital or clinic, you can use: ? An automated blood pressure machine at a pharmacy. ? A home blood pressure monitor.  If you are between 40 years and 88 years old, ask your health care provider if you should take aspirin to prevent strokes.  Have regular diabetes  screenings. This involves taking a blood sample to check your fasting blood sugar level. ? If you are at a normal weight and have a low risk for diabetes, have this test once every three years after 55 years of age. ? If you are overweight and have a high risk for diabetes, consider being tested at a younger age or more often. Preventing infection Hepatitis B  If you have a higher risk for hepatitis B, you should be screened for this virus. You are considered at high risk for hepatitis B if: ? You were born in a country where hepatitis B is common. Ask your health care provider which countries are considered high risk. ? Your parents were born in a high-risk country, and you have not been immunized against hepatitis B (hepatitis B vaccine). ? You have HIV or AIDS. ? You use needles to inject street drugs. ? You live with someone who has hepatitis B. ? You have had sex with someone who has hepatitis B. ? You get hemodialysis treatment. ? You take certain medicines for conditions, including cancer, organ transplantation, and autoimmune conditions. Hepatitis C  Blood testing is recommended for: ? Everyone born from [birth year] through 1967. ? Anyone with known risk factors for hepatitis C. Sexually transmitted infections (STIs)  You should be screened for sexually transmitted infections (STIs) including gonorrhea and chlamydia if: ? You are sexually active and are younger than 55 years of age. ? You are older than 55 years of age and your health care provider tells you that you are at risk for this type of infection. ? Your sexual activity has changed since you were last screened and you are at an increased risk for chlamydia or gonorrhea. Ask your health care provider if you are at risk.  If you do not have HIV, but are at risk, it may be recommended that you take a prescription medicine daily to prevent HIV infection. This is called pre-exposure prophylaxis (PrEP). You are considered at risk  if: ? You are sexually active and do not regularly use condoms or know the HIV status of your partner(s). ? You take drugs by injection. ? You are sexually active with a partner who has HIV. Talk with your health care provider about whether you are at high risk of being infected with HIV. If you choose to begin PrEP, you should first be tested for HIV. You should then be tested every 3 months for as long as you are taking  PrEP. Pregnancy  If you are premenopausal and you may become pregnant, ask your health care provider about preconception counseling.  If you may become pregnant, take 400 to 800 micrograms (mcg) of folic acid every day.  If you want to prevent pregnancy, talk to your health care provider about birth control (contraception). Osteoporosis and menopause  Osteoporosis is a disease in which the bones lose minerals and strength with aging. This can result in serious bone fractures. Your risk for osteoporosis can be identified using a bone density scan.  If you are 26 years of age or older, or if you are at risk for osteoporosis and fractures, ask your health care provider if you should be screened.  Ask your health care provider whether you should take a calcium or vitamin D supplement to lower your risk for osteoporosis.  Menopause may have certain physical symptoms and risks.  Hormone replacement therapy may reduce some of these symptoms and risks. Talk to your health care provider about whether hormone replacement therapy is right for you. Follow these instructions at home:  Schedule regular health, dental, and eye exams.  Stay current with your immunizations.  Do not use any tobacco products including cigarettes, chewing tobacco, or electronic cigarettes.  If you are pregnant, do not drink alcohol.  If you are breastfeeding, limit how much and how often you drink alcohol.  Limit alcohol intake to no more than 1 drink per day for nonpregnant women. One drink equals  12 ounces of beer, 5 ounces of wine, or 1 ounces of hard liquor.  Do not use street drugs.  Do not share needles.  Ask your health care provider for help if you need support or information about quitting drugs.  Tell your health care provider if you often feel depressed.  Tell your health care provider if you have ever been abused or do not feel safe at home. This information is not intended to replace advice given to you by your health care provider. Make sure you discuss any questions you have with your health care provider. Document Released: 12/27/2010 Document Revised: 11/19/2015 Document Reviewed: 03/17/2015 Elsevier Interactive Patient Education  2019 [employer].

## 2018-08-15 NOTE — Progress Notes (Signed)
[name] is a 53 y.o. female with the following history as recorded in [employer]:  Patient Active Problem List   Diagnosis Date Noted  . Chronic daily headache 10/31/2013    Current Outpatient Medications  Medication Sig Dispense Refill  . butalbital-acetaminophen-caffeine (FIORICET, ESGIC) 50-325-40 MG tablet Take by mouth.    . Levonorgestrel (LILETTA) 19.5 MCG/DAY IUD IUD by Intrauterine route.    . Multiple Vitamin (MULTIVITAMIN) tablet Take 1 tablet by mouth daily.    . valACYclovir (VALTREX) 500 MG tablet as needed.     No current facility-administered medications for this visit.     Allergies: Latex  Past Medical History:  Diagnosis Date  . ASCUS on Pap smear 02/2007   NEG HPV  . Heart murmur     Past Surgical History:  Procedure Laterality Date  . CESAREAN SECTION    . HYSTEROSCOPY  03/2006   WITH D&C  . TONSILLECTOMY AND ADENOIDECTOMY      Family History  Problem Relation Age of Onset  . Hypertension Mother   . Breast cancer Maternal Grandmother     Social History   Tobacco Use  . Smoking status: Never Smoker  . Smokeless tobacco: Never Used  Substance Use Topics  . Alcohol use: Yes    Comment: OCCASSIONALLY     Subjective:  [name] is here today to establish care as a new patient to our practice, followed with a PCP years ago, not recently. She is routinely followed by obgyn for routine womens health care needs, fibroids; and Optometry for glaucoma, astigmatism She is not currently on any daily medications. She has just resigned from her job at [employer], [place] for a new job, enjoys shows and music around town in her free time.  CPE-  Last dental exam: 2020 Last vision exam: 2020 PAP: up to date with GYN per pt Mammogram- up to date with GYn per pt Colonoscopy: up to date per pt, done at age 54- normal, unsure of where, she will look for records at home Lipids: lipid panel ordered DM screening- not indicated Vaccinations: up to date Diet and  exercise: exercise 3-4 days/week with trainer, vegetarian diet   Review of Systems  Constitutional: Negative for chills and fever.  HENT: Negative for hearing loss.   Eyes: Negative for blurred vision and double vision.  Respiratory: Negative for cough and shortness of breath.   Cardiovascular: Negative for chest pain and palpitations.  Gastrointestinal: Negative for abdominal pain, constipation, diarrhea, nausea and vomiting.  Genitourinary: Negative for dysuria and hematuria.  Musculoskeletal: Negative for falls.  Skin: Negative for rash.  Neurological: Negative for speech change, loss of consciousness, weakness and headaches.  Endo/Heme/Allergies: Negative for environmental allergies. Does not bruise/bleed easily.  Psychiatric/Behavioral: Negative for depression. The patient is not nervous/anxious.    Objective:  Vitals:   08/15/18 0837  BP: 120/90  Pulse: 67  SpO2: 95%  Weight: 162 lb (73.5 kg)  Height: 5' 2.75" (1.594 m)    General: Well developed, well nourished, in no acute distress  Skin : Warm and dry.  Head: Normocephalic and atraumatic  Eyes: Sclera and conjunctiva clear; pupils round and reactive to light; extraocular movements intact  Ears: External normal; canals clear; tympanic membranes normal  Oropharynx: Pink, supple. No suspicious lesions  Neck: Supple without thyromegaly, adenopathy  Lungs: Respirations unlabored; clear to auscultation bilaterally without wheeze, rales, rhonchi  CVS exam: normal rate, regular rhythm, normal S1, S2, no murmurs, rubs, clicks or gallops.  Abdomen: Soft; nontender;  nondistended; normoactive bowel sounds; no masses or hepatosplenomegaly  Musculoskeletal: No deformities; no active joint inflammation  Extremities: No edema, cyanosis, clubbing  Vessels: Symmetric bilaterally  Neurologic: Alert and oriented; speech intact; face symmetrical; moves all extremities well; CNII-XII intact without focal deficit  Psychiatric: Normal mood  and affect.  Assessment:  1. Routine general medical examination at a health care facility   2. Screening for cholesterol level   3. Encounter for hepatitis C virus screening test for high risk patient     Plan:   Return in about 1 year (around 08/16/2019) for CPE.  Orders Placed This Encounter  Procedures  . Comprehensive metabolic panel    Standing Status:   Future    Standing Expiration Date:   08/16/2019  . CBC    Standing Status:   Future    Standing Expiration Date:   08/16/2019  . Lipid panel    Standing Status:   Future    Standing Expiration Date:   08/16/2019  . Hepatitis C antibody    Standing Status:   Future    Standing Expiration Date:   09/13/2018    Requested Prescriptions    No prescriptions requested or ordered in this encounter

## 2018-08-20 ENCOUNTER — Other Ambulatory Visit (INDEPENDENT_AMBULATORY_CARE_PROVIDER_SITE_OTHER): Payer: BC Managed Care – PPO

## 2018-08-20 ENCOUNTER — Ambulatory Visit (INDEPENDENT_AMBULATORY_CARE_PROVIDER_SITE_OTHER): Payer: BC Managed Care – PPO

## 2018-08-20 DIAGNOSIS — Z13220 Encounter for screening for lipoid disorders: Secondary | ICD-10-CM | POA: Diagnosis not present

## 2018-08-20 DIAGNOSIS — Z299 Encounter for prophylactic measures, unspecified: Secondary | ICD-10-CM

## 2018-08-20 DIAGNOSIS — Z0000 Encounter for general adult medical examination without abnormal findings: Secondary | ICD-10-CM | POA: Diagnosis not present

## 2018-08-20 DIAGNOSIS — Z9189 Other specified personal risk factors, not elsewhere classified: Secondary | ICD-10-CM

## 2018-08-20 DIAGNOSIS — Z23 Encounter for immunization: Secondary | ICD-10-CM | POA: Diagnosis not present

## 2018-08-20 DIAGNOSIS — Z1159 Encounter for screening for other viral diseases: Secondary | ICD-10-CM

## 2018-08-20 LAB — LIPID PANEL
Cholesterol: 177 mg/dL (ref 0–200)
HDL: 49.9 mg/dL (ref >39.00)
LDL CALC: 103 mg/dL — AB (ref 0–99)
NonHDL: 126.87
TRIGLYCERIDES: 117 mg/dL (ref 0.0–149.0)
Total CHOL/HDL Ratio: 4
VLDL: 23.4 mg/dL (ref 0.0–40.0)

## 2018-08-20 LAB — CBC
HCT: 38.9 % (ref 36.0–46.0)
Hemoglobin: 13.4 g/dL (ref 12.0–15.0)
MCHC: 34.4 g/dL (ref 30.0–36.0)
MCV: 94.7 fl (ref 78.0–100.0)
PLATELETS: 196 10*3/uL (ref 150.0–400.0)
RBC: 4.11 Mil/uL (ref 3.87–5.11)
RDW: 13.2 % (ref 11.5–15.5)
WBC: 4.1 10*3/uL (ref 4.0–10.5)

## 2018-08-20 LAB — COMPREHENSIVE METABOLIC PANEL
ALBUMIN: 4.4 g/dL (ref 3.5–5.2)
ALK PHOS: 64 U/L (ref 39–117)
ALT: 23 U/L (ref 0–35)
AST: 21 U/L (ref 0–37)
BUN: 11 mg/dL (ref 6–23)
CALCIUM: 9.2 mg/dL (ref 8.4–10.5)
CHLORIDE: 106 meq/L (ref 96–112)
CO2: 27 mEq/L (ref 19–32)
CREATININE: 0.71 mg/dL (ref 0.40–1.20)
GFR: 85.54 mL/min (ref >60.00)
Glucose, Bld: 93 mg/dL (ref 70–99)
Potassium: 4.2 mEq/L (ref 3.5–5.1)
SODIUM: 139 meq/L (ref 135–145)
TOTAL PROTEIN: 7.2 g/dL (ref 6.0–8.3)
Total Bilirubin: 0.6 mg/dL (ref 0.2–1.2)

## 2018-08-21 LAB — HEPATITIS C ANTIBODY
HEP C AB: NONREACTIVE
SIGNAL TO CUT-OFF: 0.02 (ref <1.00)

## 2018-08-24 ENCOUNTER — Encounter: Payer: Self-pay | Admitting: Nurse Practitioner

## 2018-10-22 ENCOUNTER — Ambulatory Visit: Payer: BC Managed Care – PPO

## 2018-12-21 ENCOUNTER — Ambulatory Visit (INDEPENDENT_AMBULATORY_CARE_PROVIDER_SITE_OTHER): Payer: Self-pay

## 2018-12-21 DIAGNOSIS — Z23 Encounter for immunization: Secondary | ICD-10-CM

## 2018-12-21 DIAGNOSIS — Z299 Encounter for prophylactic measures, unspecified: Secondary | ICD-10-CM

## 2019-01-07 DIAGNOSIS — N898 Other specified noninflammatory disorders of vagina: Secondary | ICD-10-CM | POA: Diagnosis not present

## 2019-02-01 NOTE — Progress Notes (Signed)
Office Visit Note  Patient: [name]             Date of Birth: 02-11-1966           MRN: [id number]             PCP: Aloha Gell, MD Referring: Aloha Gell, MD Visit Date: 02/04/2019 Occupation: teacher  Subjective:  Pain in multiple joints.   History of Present Illness: Anelise Staron is a 53 y.o. female seen in consultation per request of her GYN.  According to patient she has had history of joint pain for at least 10 years.  She describes pain in her left shoulder, bilateral hands, bilateral knee joints and her feet.  She has occasional discomfort in her cervical spine.  She states she has been exercising, does yoga, weight training and has some dietary modifications over that time.  She states the joint pain has improved since she has been exercising more.  She has occasional noted occasional swelling in her bilateral hands.  There is no family history of autoimmune disease.  Her father has osteoarthritis.  Activities of Daily Living:  Patient reports morning stiffness for 3-5 minutes.   Patient Denies nocturnal pain.  Difficulty dressing/grooming: Denies Difficulty climbing stairs: Denies Difficulty getting out of chair: Denies Difficulty using hands for taps, buttons, cutlery, and/or writing: Reports  Review of Systems  Constitutional: Negative for fatigue, night sweats, weight gain and weight loss.  HENT: Negative for mouth sores, trouble swallowing, trouble swallowing, mouth dryness and nose dryness.   Eyes: Negative for pain, redness, itching, visual disturbance and dryness.  Respiratory: Negative for cough, shortness of breath, wheezing and difficulty breathing.   Cardiovascular: Negative for chest pain, palpitations, hypertension, irregular heartbeat and swelling in legs/feet.  Gastrointestinal: Negative for abdominal pain, blood in stool, constipation and diarrhea.  Endocrine: Negative for increased urination.  Genitourinary: Negative for painful urination,  pelvic pain and vaginal dryness.  Musculoskeletal: Positive for arthralgias, joint pain, joint swelling and morning stiffness. Negative for myalgias, muscle weakness, muscle tenderness and myalgias.  Skin: Positive for rash. Negative for color change, hair loss, skin tightness, ulcers and sensitivity to sunlight.       Recent poison ivy on her right lower extremity  Allergic/Immunologic: Negative for susceptible to infections.  Neurological: Positive for numbness and weakness. Negative for dizziness, light-headedness, headaches, memory loss and night sweats.  Hematological: Negative for swollen glands.  Psychiatric/Behavioral: Negative for depressed mood, confusion and sleep disturbance. The patient is not nervous/anxious.     PMFS History:  Patient Active Problem List   Diagnosis Date Noted  . Routine general medical examination at a health care facility 08/15/2018  . Chronic daily headache 10/31/2013    Past Medical History:  Diagnosis Date  . ASCUS on Pap smear 02/2007   NEG HPV  . Frequent headaches   . Heart murmur     Family History  Problem Relation Age of Onset  . Arthritis Father   . Hypertension Mother   . Breast cancer Maternal Grandmother   . Healthy Daughter   . Healthy Son    Past Surgical History:  Procedure Laterality Date  . CESAREAN SECTION    . HYSTEROSCOPY  03/2006   WITH D&C  . TONSILLECTOMY AND ADENOIDECTOMY  1978   Social History   Social History Narrative   Married 2 children   Right handed   Currently in graduate school   3 cups daily   Immunization History  Administered Date(s) Administered  .  Influenza,inj,Quad PF,6+ Mos 04/12/2018  . Zoster Recombinat (Shingrix) 08/20/2018, 12/21/2018     Objective: Vital Signs: BP 116/70 (BP Location: Right Arm, Patient Position: Sitting, Cuff Size: Normal)   Pulse 60   Resp 12   Ht 5' 2.75" (1.594 m)   Wt 162 lb (73.5 kg)   BMI 28.93 kg/m    Physical Exam Vitals signs and nursing note  reviewed.  Constitutional:      Appearance: She is well-developed.  HENT:     Head: Normocephalic and atraumatic.  Eyes:     Conjunctiva/sclera: Conjunctivae normal.  Neck:     Musculoskeletal: Normal range of motion.  Cardiovascular:     Rate and Rhythm: Normal rate and regular rhythm.     Heart sounds: Normal heart sounds.  Pulmonary:     Effort: Pulmonary effort is normal.     Breath sounds: Normal breath sounds.  Abdominal:     General: Bowel sounds are normal.     Palpations: Abdomen is soft.  Lymphadenopathy:     Cervical: No cervical adenopathy.  Skin:    General: Skin is warm and dry.     Capillary Refill: Capillary refill takes less than 2 seconds.  Neurological:     Mental Status: She is alert and oriented to person, place, and time.  Psychiatric:        Behavior: Behavior normal.      Musculoskeletal Exam: C-spine thoracic and lumbar spine with good range of motion.  She has some crepitus in her anterior aspect of the left shoulder consistent with tendinopathy.  Elbow joints, wrist joints with good range of motion.  She had bilateral CMC, PIP and DIP thickening with no synovitis.  Hip joints and knee joints with good range of motion.  She is crepitus with range of motion of bilateral knee joints.  She has bilateral PIP and DIP thickening in her feet with bilateral hallux rigidus.  CDAI Exam: CDAI Score: - Patient Global: -; Provider Global: - Swollen: -; Tender: - Joint Exam   No joint exam has been documented for this visit   There is currently no information documented on the homunculus. Go to the Rheumatology activity and complete the homunculus joint exam.  Investigation: No additional findings.  Imaging: No results found.  Recent Labs: Lab Results  Component Value Date   WBC 4.1 08/20/2018   HGB 13.4 08/20/2018   PLT 196.0 08/20/2018   NA 139 08/20/2018   K 4.2 08/20/2018   CL 106 08/20/2018   CO2 27 08/20/2018   GLUCOSE 93 08/20/2018   BUN  11 08/20/2018   CREATININE 0.71 08/20/2018   BILITOT 0.6 08/20/2018   ALKPHOS 64 08/20/2018   AST 21 08/20/2018   ALT 23 08/20/2018   PROT 7.2 08/20/2018   ALBUMIN 4.4 08/20/2018   CALCIUM 9.2 08/20/2018    Speciality Comments: No specialty comments available.  Procedures:  No procedures performed Allergies: Grass extracts [gramineae pollens] and Latex   Assessment / Plan:     Visit Diagnoses: Pain in both hands -patient complains of pain and discomfort in her bilateral hands for many years.  On clinical examination she has DIP and PIP thickening with no synovitis.  These findings are consistent with osteoarthritis.  Plan: XR Hand 2 View Right, XR Hand 2 View Left, x-rays were consistent mild osteoarthritis.  Joint protection was discussed.  Chronic pain of both knees -she complains of pain and discomfort in her bilateral knee joints.  She is some crepitus on  examination.  No synovitis or effusion was noted.  Plan: XR KNEE 3 VIEW RIGHT, XR KNEE 3 VIEW LEFT, x-rays were consistent with mild chondromalacia patella.  I have given her a handout on knee exercises.  Pain in both feet -she has DIP and PIP thickening with hallux rigidus bilaterally.  She also has bilateral pes planus.  She has been using shoes with the arch support.  Plan: XR Foot 2 Views Right, XR Foot 2 Views Left, x-rays revealed mild osteoarthritis of the feet.  Chronic left shoulder pain -clinical findings are consistent with tendinopathy.  She had good range of motion of her left shoulder joint.  I have given her a handout on shoulder exercises.  Chronic daily headache -she uses Fioricet as needed.  History of anemia -patient gives history of fibroids and heavy bleeding.  Orders: Orders Placed This Encounter  Procedures  . XR Hand 2 View Right  . XR Hand 2 View Left  . XR KNEE 3 VIEW RIGHT  . XR KNEE 3 VIEW LEFT  . XR Foot 2 Views Right  . XR Foot 2 Views Left   No orders of the defined types were placed in this  encounter.   Face-to-face time spent with patient was 45 minutes. Greater than 50% of time was spent in counseling and coordination of care.  Follow-Up Instructions: Return for Osteoarthritis.   Bo Merino, MD  Note - This record has been created using Editor, commissioning.  Chart creation errors have been sought, but may not always  have been located. Such creation errors do not reflect on  the standard of medical care.

## 2019-02-04 ENCOUNTER — Ambulatory Visit: Payer: Self-pay

## 2019-02-04 ENCOUNTER — Ambulatory Visit (INDEPENDENT_AMBULATORY_CARE_PROVIDER_SITE_OTHER): Payer: BC Managed Care – PPO | Admitting: Rheumatology

## 2019-02-04 ENCOUNTER — Other Ambulatory Visit: Payer: Self-pay

## 2019-02-04 ENCOUNTER — Encounter: Payer: Self-pay | Admitting: Rheumatology

## 2019-02-04 VITALS — BP 116/70 | HR 60 | Resp 12 | Ht 62.75 in | Wt 162.0 lb

## 2019-02-04 DIAGNOSIS — M79642 Pain in left hand: Secondary | ICD-10-CM

## 2019-02-04 DIAGNOSIS — M79671 Pain in right foot: Secondary | ICD-10-CM | POA: Diagnosis not present

## 2019-02-04 DIAGNOSIS — Z862 Personal history of diseases of the blood and blood-forming organs and certain disorders involving the immune mechanism: Secondary | ICD-10-CM

## 2019-02-04 DIAGNOSIS — G8929 Other chronic pain: Secondary | ICD-10-CM

## 2019-02-04 DIAGNOSIS — M25562 Pain in left knee: Secondary | ICD-10-CM

## 2019-02-04 DIAGNOSIS — R519 Headache, unspecified: Secondary | ICD-10-CM

## 2019-02-04 DIAGNOSIS — M79672 Pain in left foot: Secondary | ICD-10-CM | POA: Diagnosis not present

## 2019-02-04 DIAGNOSIS — M25512 Pain in left shoulder: Secondary | ICD-10-CM | POA: Diagnosis not present

## 2019-02-04 DIAGNOSIS — M79641 Pain in right hand: Secondary | ICD-10-CM

## 2019-02-04 DIAGNOSIS — R51 Headache: Secondary | ICD-10-CM

## 2019-02-04 DIAGNOSIS — M25561 Pain in right knee: Secondary | ICD-10-CM

## 2019-02-04 NOTE — Patient Instructions (Signed)
Hand Exercises Hand exercises can be helpful for almost anyone. These exercises can strengthen the hands, improve flexibility and movement, and increase blood flow to the hands. These results can make work and daily tasks easier. Hand exercises can be especially helpful for people who have joint pain from arthritis or have nerve damage from overuse (carpal tunnel syndrome). These exercises can also help people who have injured a hand. Exercises Most of these hand exercises are gentle stretching and motion exercises. It is usually safe to do them often throughout the day. Warming up your hands before exercise may help to reduce stiffness. You can do this with gentle massage or by placing your hands in warm water for 10-15 minutes. It is normal to feel some stretching, pulling, tightness, or mild discomfort as you begin new exercises. This will gradually improve. Stop an exercise right away if you feel sudden, severe pain or your pain gets worse. Ask your health care provider which exercises are best for you. Knuckle bend or "claw" fist 1. Stand or sit with your arm, hand, and all five fingers pointed straight up. Make sure to keep your wrist straight during the exercise. 2. Gently bend your fingers down toward your palm until the tips of your fingers are touching the top of your palm. Keep your big knuckle straight and just bend the small knuckles in your fingers. 3. Hold this position for __________ seconds. 4. Straighten (extend) your fingers back to the starting position. Repeat this exercise 5-10 times with each hand. Full finger fist 1. Stand or sit with your arm, hand, and all five fingers pointed straight up. Make sure to keep your wrist straight during the exercise. 2. Gently bend your fingers into your palm until the tips of your fingers are touching the middle of your palm. 3. Hold this position for __________ seconds. 4. Extend your fingers back to the starting position, stretching every  joint fully. Repeat this exercise 5-10 times with each hand. Straight fist 1. Stand or sit with your arm, hand, and all five fingers pointed straight up. Make sure to keep your wrist straight during the exercise. 2. Gently bend your fingers at the big knuckle, where your fingers meet your hand, and the middle knuckle. Keep the knuckle at the tips of your fingers straight and try to touch the bottom of your palm. 3. Hold this position for __________ seconds. 4. Extend your fingers back to the starting position, stretching every joint fully. Repeat this exercise 5-10 times with each hand. Tabletop 1. Stand or sit with your arm, hand, and all five fingers pointed straight up. Make sure to keep your wrist straight during the exercise. 2. Gently bend your fingers at the big knuckle, where your fingers meet your hand, as far down as you can while keeping the small knuckles in your fingers straight. Think of forming a tabletop with your fingers. 3. Hold this position for __________ seconds. 4. Extend your fingers back to the starting position, stretching every joint fully. Repeat this exercise 5-10 times with each hand. Finger spread 1. Place your hand flat on a table with your palm facing down. Make sure your wrist stays straight as you do this exercise. 2. Spread your fingers and thumb apart from each other as far as you can until you feel a gentle stretch. Hold this position for __________ seconds. 3. Bring your fingers and thumb tight together again. Hold this position for __________ seconds. Repeat this exercise 5-10 times with each hand.   Making circles 1. Stand or sit with your arm, hand, and all five fingers pointed straight up. Make sure to keep your wrist straight during the exercise. 2. Make a circle by touching the tip of your thumb to the tip of your index finger. 3. Hold for __________ seconds. Then open your hand wide. 4. Repeat this motion with your thumb and each finger on your hand.  Repeat this exercise 5-10 times with each hand. Thumb motion 1. Sit with your forearm resting on a table and your wrist straight. Your thumb should be facing up toward the ceiling. Keep your fingers relaxed as you move your thumb. 2. Lift your thumb up as high as you can toward the ceiling. Hold for __________ seconds. 3. Bend your thumb across your palm as far as you can, reaching the tip of your thumb for the small finger (pinkie) side of your palm. Hold for __________ seconds. Repeat this exercise 5-10 times with each hand. Grip strengthening  1. Hold a stress ball or other soft ball in the middle of your hand. 2. Slowly increase the pressure, squeezing the ball as much as you can without causing pain. Think of bringing the tips of your fingers into the middle of your palm. All of your finger joints should bend when doing this exercise. 3. Hold your squeeze for __________ seconds, then relax. Repeat this exercise 5-10 times with each hand. Contact a health care provider if:  Your hand pain or discomfort gets much worse when you do an exercise.  Your hand pain or discomfort does not improve within 2 hours after you exercise. If you have any of these problems, stop doing these exercises right away. Do not do them again unless your health care provider says that you can. Get help right away if:  You develop sudden, severe hand pain or swelling. If this happens, stop doing these exercises right away. Do not do them again unless your health care provider says that you can. This information is not intended to replace advice given to you by your health care provider. Make sure you discuss any questions you have with your health care provider. Document Released: 05/25/2015 Document Revised: 10/04/2018 Document Reviewed: 06/14/2018 Else[employer] Patient Education  2020 [employer], 15(4), [phone]. Retrieved April 02, 2018 from [email or web address].com/[email or web address]">  Knee  Exercises Ask your health care provider which exercises are safe for you. Do exercises exactly as told by your health care provider and adjust them as directed. It is normal to feel mild stretching, pulling, tightness, or discomfort as you do these exercises. Stop right away if you feel sudden pain or your pain gets worse. Do not begin these exercises until told by your health care provider. Stretching and range-of-motion exercises These exercises warm up your muscles and joints and improve the movement and flexibility of your knee. These exercises also help to relieve pain and swelling. Knee extension, prone 5. Lie on your abdomen (prone position) on a bed. 6. Place your left / right knee just beyond the edge of the surface so your knee is not on the bed. You can put a towel under your left / right thigh just above your kneecap for comfort. 7. Relax your leg muscles and allow gravity to straighten your knee (extension). You should feel a stretch behind your left / right knee. 8. Hold this position for __________ seconds. 9. Scoot up so your knee is supported between repetitions. Repeat __________ times. Complete this exercise __________ times  a day. Knee flexion, active  5. Lie on your back with both legs straight. If this causes back discomfort, bend your left / right knee so your foot is flat on the floor. 6. Slowly slide your left / right heel back toward your buttocks. Stop when you feel a gentle stretch in the front of your knee or thigh (flexion). 7. Hold this position for __________ seconds. 8. Slowly slide your left / right heel back to the starting position. Repeat __________ times. Complete this exercise __________ times a day. Quadriceps stretch, prone  5. Lie on your abdomen on a firm surface, such as a bed or padded floor. 6. Bend your left / right knee and hold your ankle. If you cannot reach your ankle or pant leg, loop a belt around your foot and grab the belt instead. 7.  Gently pull your heel toward your buttocks. Your knee should not slide out to the side. You should feel a stretch in the front of your thigh and knee (quadriceps). 8. Hold this position for __________ seconds. Repeat __________ times. Complete this exercise __________ times a day. Hamstring, supine 1. Lie on your back (supine position). 2. Loop a belt or towel over the ball of your left / right foot. The ball of your foot is on the walking surface, right under your toes. 3. Straighten your left / right knee and slowly pull on the belt to raise your leg until you feel a gentle stretch behind your knee (hamstring). ? Do not let your knee bend while you do this. ? Keep your other leg flat on the floor. 4. Hold this position for __________ seconds. Repeat __________ times. Complete this exercise __________ times a day. Strengthening exercises These exercises build strength and endurance in your knee. Endurance is the ability to use your muscles for a long time, even after they get tired. Quadriceps, isometric This exercise stretches the muscles in front of your thigh (quadriceps) without moving your knee joint (isometric). 4. Lie on your back with your left / right leg extended and your other knee bent. Put a rolled towel or small pillow under your knee if told by your health care provider. 5. Slowly tense the muscles in the front of your left / right thigh. You should see your kneecap slide up toward your hip or see increased dimpling just above the knee. This motion will push the back of the knee toward the floor. 6. For __________ seconds, hold the muscle as tight as you can without increasing your pain. 7. Relax the muscles slowly and completely. Repeat __________ times. Complete this exercise __________ times a day. Straight leg raises This exercise stretches the muscles in front of your thigh (quadriceps) and the muscles that move your hips (hip flexors). 5. Lie on your back with your left /  right leg extended and your other knee bent. 6. Tense the muscles in the front of your left / right thigh. You should see your kneecap slide up or see increased dimpling just above the knee. Your thigh may even shake a bit. 7. Keep these muscles tight as you raise your leg 4-6 inches (10-15 cm) off the floor. Do not let your knee bend. 8. Hold this position for __________ seconds. 9. Keep these muscles tense as you lower your leg. 10. Relax your muscles slowly and completely after each repetition. Repeat __________ times. Complete this exercise __________ times a day. Hamstring, isometric 4. Lie on your back on a firm surface. 5.  Bend your left / right knee about __________ degrees. 6. Dig your left / right heel into the surface as if you are trying to pull it toward your buttocks. Tighten the muscles in the back of your thighs (hamstring) to "dig" as hard as you can without increasing any pain. 7. Hold this position for __________ seconds. 8. Release the tension gradually and allow your muscles to relax completely for __________ seconds after each repetition. Repeat __________ times. Complete this exercise __________ times a day. Hamstring curls If told by your health care provider, do this exercise while wearing ankle weights. Begin with __________ lb weights. Then increase the weight by 1 lb (0.5 kg) increments. Do not wear ankle weights that are more than __________ lb. 4. Lie on your abdomen with your legs straight. 5. Bend your left / right knee as far as you can without feeling pain. Keep your hips flat against the floor. 6. Hold this position for __________ seconds. 7. Slowly lower your leg to the starting position. Repeat __________ times. Complete this exercise __________ times a day. Squats This exercise strengthens the muscles in front of your thigh and knee (quadriceps). 1. Stand in front of a table, with your feet and knees pointing straight ahead. You may rest your hands on the  table for balance but not for support. 2. Slowly bend your knees and lower your hips like you are going to sit in a chair. ? Keep your weight over your heels, not over your toes. ? Keep your lower legs upright so they are parallel with the table legs. ? Do not let your hips go lower than your knees. ? Do not bend lower than told by your health care provider. ? If your knee pain increases, do not bend as low. 3. Hold the squat position for __________ seconds. 4. Slowly push with your legs to return to standing. Do not use your hands to pull yourself to standing. Repeat __________ times. Complete this exercise __________ times a day. Wall slides This exercise strengthens the muscles in front of your thigh and knee (quadriceps). 1. Lean your back against a smooth wall or door, and walk your feet out 18-24 inches (46-61 cm) from it. 2. Place your feet hip-width apart. 3. Slowly slide down the wall or door until your knees bend __________ degrees. Keep your knees over your heels, not over your toes. Keep your knees in line with your hips. 4. Hold this position for __________ seconds. Repeat __________ times. Complete this exercise __________ times a day. Straight leg raises This exercise strengthens the muscles that rotate the leg at the hip and move it away from your body (hip abductors). 1. Lie on your side with your left / right leg in the top position. Lie so your head, shoulder, knee, and hip line up. You may bend your bottom knee to help you keep your balance. 2. Roll your hips slightly forward so your hips are stacked directly over each other and your left / right knee is facing forward. 3. Leading with your heel, lift your top leg 4-6 inches (10-15 cm). You should feel the muscles in your outer hip lifting. ? Do not let your foot drift forward. ? Do not let your knee roll toward the ceiling. 4. Hold this position for __________ seconds. 5. Slowly return your leg to the starting position.  6. Let your muscles relax completely after each repetition. Repeat __________ times. Complete this exercise __________ times a day. Straight leg raises  This exercise stretches the muscles that move your hips away from the front of the pelvis (hip extensors). 1. Lie on your abdomen on a firm surface. You can put a pillow under your hips if that is more comfortable. 2. Tense the muscles in your buttocks and lift your left / right leg about 4-6 inches (10-15 cm). Keep your knee straight as you lift your leg. 3. Hold this position for __________ seconds. 4. Slowly lower your leg to the starting position. 5. Let your leg relax completely after each repetition. Repeat __________ times. Complete this exercise __________ times a day. This information is not intended to replace advice given to you by your health care provider. Make sure you discuss any questions you have with your health care provider. Document Released: 04/27/2005 Document Revised: 04/03/2018 Document Reviewed: 04/03/2018 Elsevier Patient Education  2020 [place]. Shoulder Exercises Ask your health care provider which exercises are safe for you. Do exercises exactly as told by your health care provider and adjust them as directed. It is normal to feel mild stretching, pulling, tightness, or discomfort as you do these exercises. Stop right away if you feel sudden pain or your pain gets worse. Do not begin these exercises until told by your health care provider. Stretching exercises External rotation and abduction This exercise is sometimes called corner stretch. This exercise rotates your arm outward (external rotation) and moves your arm out from your body (abduction). 1. Stand in a doorway with one of your feet slightly in front of the other. This is called a staggered stance. If you cannot reach your forearms to the door frame, stand facing a corner of a room. 2. Choose one of the following positions as told by your health care  provider: ? Place your hands and forearms on the door frame above your head. ? Place your hands and forearms on the door frame at the height of your head. ? Place your hands on the door frame at the height of your elbows. 3. Slowly move your weight onto your front foot until you feel a stretch across your chest and in the front of your shoulders. Keep your head and chest upright and keep your abdominal muscles tight. 4. Hold for __________ seconds. 5. To release the stretch, shift your weight to your back foot. Repeat __________ times. Complete this exercise __________ times a day. Extension, standing 1. Stand and hold a broomstick, a cane, or a similar object behind your back. ? Your hands should be a little wider than shoulder width apart. ? Your palms should face away from your back. 2. Keeping your elbows straight and your shoulder muscles relaxed, move the stick away from your body until you feel a stretch in your shoulders (extension). ? Avoid shrugging your shoulders while you move the stick. Keep your shoulder blades tucked down toward the middle of your back. 3. Hold for __________ seconds. 4. Slowly return to the starting position. Repeat __________ times. Complete this exercise __________ times a day. Range-of-motion exercises Pendulum  1. Stand near a wall or a surface that you can hold onto for balance. 2. Bend at the waist and let your left / right arm hang straight down. Use your other arm to support you. Keep your back straight and do not lock your knees. 3. Relax your left / right arm and shoulder muscles, and move your hips and your trunk so your left / right arm swings freely. Your arm should swing because of the motion of  your body, not because you are using your arm or shoulder muscles. 4. Keep moving your hips and trunk so your arm swings in the following directions, as told by your health care provider: ? Side to side. ? Forward and backward. ? In clockwise and  counterclockwise circles. 5. Continue each motion for __________ seconds, or for as long as told by your health care provider. 6. Slowly return to the starting position. Repeat __________ times. Complete this exercise __________ times a day. Shoulder flexion, standing  1. Stand and hold a broomstick, a cane, or a similar object. Place your hands a little more than shoulder width apart on the object. Your left / right hand should be palm up, and your other hand should be palm down. 2. Keep your elbow straight and your shoulder muscles relaxed. Push the stick up with your healthy arm to raise your left / right arm in front of your body, and then over your head until you feel a stretch in your shoulder (flexion). ? Avoid shrugging your shoulder while you raise your arm. Keep your shoulder blade tucked down toward the middle of your back. 3. Hold for __________ seconds. 4. Slowly return to the starting position. Repeat __________ times. Complete this exercise __________ times a day. Shoulder abduction, standing 1. Stand and hold a broomstick, a cane, or a similar object. Place your hands a little more than shoulder width apart on the object. Your left / right hand should be palm up, and your other hand should be palm down. 2. Keep your elbow straight and your shoulder muscles relaxed. Push the object across your body toward your left / right side. Raise your left / right arm to the side of your body (abduction) until you feel a stretch in your shoulder. ? Do not raise your arm above shoulder height unless your health care provider tells you to do that. ? If directed, raise your arm over your head. ? Avoid shrugging your shoulder while you raise your arm. Keep your shoulder blade tucked down toward the middle of your back. 3. Hold for __________ seconds. 4. Slowly return to the starting position. Repeat __________ times. Complete this exercise __________ times a day. Internal rotation  1. Place  your left / right hand behind your back, palm up. 2. Use your other hand to dangle an exercise band, a towel, or a similar object over your shoulder. Grasp the band with your left / right hand so you are holding on to both ends. 3. Gently pull up on the band until you feel a stretch in the front of your left / right shoulder. The movement of your arm toward the center of your body is called internal rotation. ? Avoid shrugging your shoulder while you raise your arm. Keep your shoulder blade tucked down toward the middle of your back. 4. Hold for __________ seconds. 5. Release the stretch by letting go of the band and lowering your hands. Repeat __________ times. Complete this exercise __________ times a day. Strengthening exercises External rotation  1. Sit in a stable chair without armrests. 2. Secure an exercise band to a stable object at elbow height on your left / right side. 3. Place a soft object, such as a folded towel or a small pillow, between your left / right upper arm and your body to move your elbow about 4 inches (10 cm) away from your side. 4. Hold the end of the exercise band so it is tight and there is  no slack. 5. Keeping your elbow pressed against the soft object, slowly move your forearm out, away from your abdomen (external rotation). Keep your body steady so only your forearm moves. 6. Hold for __________ seconds. 7. Slowly return to the starting position. Repeat __________ times. Complete this exercise __________ times a day. Shoulder abduction  1. Sit in a stable chair without armrests, or stand up. 2. Hold a __________ weight in your left / right hand, or hold an exercise band with both hands. 3. Start with your arms straight down and your left / right palm facing in, toward your body. 4. Slowly lift your left / right hand out to your side (abduction). Do not lift your hand above shoulder height unless your health care provider tells you that this is safe. ? Keep your  arms straight. ? Avoid shrugging your shoulder while you do this movement. Keep your shoulder blade tucked down toward the middle of your back. 5. Hold for __________ seconds. 6. Slowly lower your arm, and return to the starting position. Repeat __________ times. Complete this exercise __________ times a day. Shoulder extension 1. Sit in a stable chair without armrests, or stand up. 2. Secure an exercise band to a stable object in front of you so it is at shoulder height. 3. Hold one end of the exercise band in each hand. Your palms should face each other. 4. Straighten your elbows and lift your hands up to shoulder height. 5. Step back, away from the secured end of the exercise band, until the band is tight and there is no slack. 6. Squeeze your shoulder blades together as you pull your hands down to the sides of your thighs (extension). Stop when your hands are straight down by your sides. Do not let your hands go behind your body. 7. Hold for __________ seconds. 8. Slowly return to the starting position. Repeat __________ times. Complete this exercise __________ times a day. Shoulder row 1. Sit in a stable chair without armrests, or stand up. 2. Secure an exercise band to a stable object in front of you so it is at waist height. 3. Hold one end of the exercise band in each hand. Position your palms so that your thumbs are facing the ceiling (neutral position). 4. Bend each of your elbows to a 90-degree angle (right angle) and keep your upper arms at your sides. 5. Step back until the band is tight and there is no slack. 6. Slowly pull your elbows back behind you. 7. Hold for __________ seconds. 8. Slowly return to the starting position. Repeat __________ times. Complete this exercise __________ times a day. Shoulder press-ups  1. Sit in a stable chair that has armrests. Sit upright, with your feet flat on the floor. 2. Put your hands on the armrests so your elbows are bent and your  fingers are pointing forward. Your hands should be about even with the sides of your body. 3. Push down on the armrests and use your arms to lift yourself off the chair. Straighten your elbows and lift yourself up as much as you comfortably can. ? Move your shoulder blades down, and avoid letting your shoulders move up toward your ears. ? Keep your feet on the ground. As you get stronger, your feet should support less of your body weight as you lift yourself up. 4. Hold for __________ seconds. 5. Slowly lower yourself back into the chair. Repeat __________ times. Complete this exercise __________ times a day. Wall push-ups  1. Stand so you are facing a stable wall. Your feet should be about one arm-length away from the wall. 2. Lean forward and place your palms on the wall at shoulder height. 3. Keep your feet flat on the floor as you bend your elbows and lean forward toward the wall. 4. Hold for __________ seconds. 5. Straighten your elbows to push yourself back to the starting position. Repeat __________ times. Complete this exercise __________ times a day. This information is not intended to replace advice given to you by your health care provider. Make sure you discuss any questions you have with your health care provider. Document Released: 04/27/2005 Document Revised: 10/05/2018 Document Reviewed: 07/13/2018 Elsevier Patient Education  2020 [employer].

## 2019-02-27 ENCOUNTER — Ambulatory Visit: Payer: Self-pay | Admitting: Rheumatology

## 2019-03-19 ENCOUNTER — Other Ambulatory Visit: Payer: Self-pay

## 2019-03-19 DIAGNOSIS — Z20822 Contact with and (suspected) exposure to covid-19: Secondary | ICD-10-CM

## 2019-03-20 LAB — NOVEL CORONAVIRUS, NAA: SARS-CoV-2, NAA: NOT DETECTED

## 2019-04-12 DIAGNOSIS — R519 Headache, unspecified: Secondary | ICD-10-CM | POA: Diagnosis not present

## 2019-04-12 DIAGNOSIS — R509 Fever, unspecified: Secondary | ICD-10-CM | POA: Diagnosis not present

## 2019-04-12 DIAGNOSIS — R5383 Other fatigue: Secondary | ICD-10-CM | POA: Diagnosis not present

## 2019-04-12 DIAGNOSIS — R110 Nausea: Secondary | ICD-10-CM | POA: Diagnosis not present

## 2019-04-12 DIAGNOSIS — J029 Acute pharyngitis, unspecified: Secondary | ICD-10-CM | POA: Diagnosis not present

## 2019-04-12 DIAGNOSIS — R197 Diarrhea, unspecified: Secondary | ICD-10-CM | POA: Diagnosis not present

## 2019-04-12 DIAGNOSIS — R1031 Right lower quadrant pain: Secondary | ICD-10-CM | POA: Diagnosis not present

## 2019-04-12 DIAGNOSIS — Z20828 Contact with and (suspected) exposure to other viral communicable diseases: Secondary | ICD-10-CM | POA: Diagnosis not present

## 2019-05-14 DIAGNOSIS — Z124 Encounter for screening for malignant neoplasm of cervix: Secondary | ICD-10-CM | POA: Diagnosis not present

## 2019-05-14 DIAGNOSIS — Z1151 Encounter for screening for human papillomavirus (HPV): Secondary | ICD-10-CM | POA: Diagnosis not present

## 2019-05-14 DIAGNOSIS — Z01419 Encounter for gynecological examination (general) (routine) without abnormal findings: Secondary | ICD-10-CM | POA: Diagnosis not present

## 2019-05-14 DIAGNOSIS — Z6827 Body mass index (BMI) 27.0-27.9, adult: Secondary | ICD-10-CM | POA: Diagnosis not present

## 2019-05-14 DIAGNOSIS — Z1231 Encounter for screening mammogram for malignant neoplasm of breast: Secondary | ICD-10-CM | POA: Diagnosis not present

## 2019-07-19 DIAGNOSIS — M25512 Pain in left shoulder: Secondary | ICD-10-CM | POA: Diagnosis not present

## 2019-07-26 DIAGNOSIS — M25512 Pain in left shoulder: Secondary | ICD-10-CM | POA: Diagnosis not present

## 2019-07-31 DIAGNOSIS — M25512 Pain in left shoulder: Secondary | ICD-10-CM | POA: Diagnosis not present

## 2019-07-31 DIAGNOSIS — H40033 Anatomical narrow angle, bilateral: Secondary | ICD-10-CM | POA: Diagnosis not present

## 2019-07-31 DIAGNOSIS — M75102 Unspecified rotator cuff tear or rupture of left shoulder, not specified as traumatic: Secondary | ICD-10-CM | POA: Diagnosis not present

## 2019-09-04 DIAGNOSIS — M25512 Pain in left shoulder: Secondary | ICD-10-CM | POA: Diagnosis not present

## 2019-11-14 DIAGNOSIS — Z23 Encounter for immunization: Secondary | ICD-10-CM | POA: Diagnosis not present

## 2019-11-14 DIAGNOSIS — Z13220 Encounter for screening for lipoid disorders: Secondary | ICD-10-CM | POA: Diagnosis not present

## 2019-11-14 DIAGNOSIS — Z0000 Encounter for general adult medical examination without abnormal findings: Secondary | ICD-10-CM | POA: Diagnosis not present

## 2020-02-07 DIAGNOSIS — Z20822 Contact with and (suspected) exposure to covid-19: Secondary | ICD-10-CM | POA: Diagnosis not present

## 2020-02-21 NOTE — Progress Notes (Signed)
Office Visit Note  Patient: [name]             Date of Birth: [birth year]           MRN: [id number]             PCP: Aloha Gell, MD Referring: Aloha Gell, MD Visit Date: 02/26/2020 Occupation: @GUAROCC @  Subjective:  No chief complaint on file.   History of Present Illness: Esbeydi Manago is a 54 y.o. female with history of osteoarthritis involving multiple joints.  She states she gets intermittent swelling in her hands.  She recently went to [place] and after walking she developed swelling in her both feet.  She was experiencing some discomfort with the swelling.  The swelling did not last very long.  She continues to have some discomfort in her knee joints.  She states her left shoulder joint pain got worse and she was having difficulty lifting her arm.  She was seen by an orthopedic surgeon who did MRI and diagnosed her with rotator cuff tear.  She was advised to do exercises.  The symptoms are improving gradually.  Activities of Daily Living:  Patient reports morning stiffness for 0  minutes.   Patient Denies nocturnal pain.  Difficulty dressing/grooming: Denies Difficulty climbing stairs: Denies Difficulty getting out of chair: Denies Difficulty using hands for taps, buttons, cutlery, and/or writing: Denies  Review of Systems  Constitutional: Negative for fatigue.  HENT: Positive for mouth sores. Negative for mouth dryness and nose dryness.   Eyes: Negative for itching and dryness.  Respiratory: Negative for shortness of breath and difficulty breathing.   Cardiovascular: Negative for chest pain and palpitations.  Gastrointestinal: Negative for blood in stool, constipation and diarrhea.  Endocrine: Negative for increased urination.  Genitourinary: Negative for difficulty urinating.  Musculoskeletal: Positive for arthralgias, joint pain and joint swelling. Negative for myalgias, morning stiffness, muscle tenderness and myalgias.  Skin: Negative for color change,  rash and redness.  Allergic/Immunologic: Negative for susceptible to infections.  Neurological: Negative for dizziness, numbness, headaches, memory loss and weakness.  Hematological: Positive for bruising/bleeding tendency.  Psychiatric/Behavioral: Negative for confusion.    PMFS History:  Patient Active Problem List   Diagnosis Date Noted  . Primary osteoarthritis of both hands 02/26/2020  . Chondromalacia of both patellae 02/26/2020  . Primary osteoarthritis of both feet 02/26/2020  . Routine general medical examination at a health care facility 08/15/2018  . Chronic daily headache 10/31/2013    Past Medical History:  Diagnosis Date  . ASCUS on Pap smear 02/2007   NEG HPV  . Frequent headaches   . Heart murmur     Family History  Problem Relation Age of Onset  . Arthritis Father   . Hypertension Mother   . Breast cancer Maternal Grandmother   . Healthy Daughter   . Healthy Son    Past Surgical History:  Procedure Laterality Date  . CESAREAN SECTION    . HYSTEROSCOPY  03/2006   WITH D&C  . TONSILLECTOMY AND ADENOIDECTOMY  1978   Social History   Social History Narrative   Married 2 children   Right handed   Currently in graduate school   3 cups daily   Immunization History  Administered Date(s) Administered  . Influenza,inj,Quad PF,6+ Mos 04/12/2018  . Moderna SARS-COVID-2 Vaccination 08/31/2019, 09/28/2019  . Zoster Recombinat (Shingrix) 08/20/2018, 12/21/2018     Objective: Vital Signs: BP 117/72 (BP Location: Left Arm, Patient Position: Sitting, Cuff Size: Normal)   Pulse Marland Kitchen)  54   Resp 13   Ht 5\' 3"  (1.6 m)   Wt 160 lb 3.2 oz (72.7 kg)   BMI 28.38 kg/m    Physical Exam Vitals and nursing note reviewed.  Constitutional:      Appearance: She is well-developed.  HENT:     Head: Normocephalic and atraumatic.  Eyes:     Conjunctiva/sclera: Conjunctivae normal.  Cardiovascular:     Rate and Rhythm: Normal rate and regular rhythm.     Heart sounds:  Normal heart sounds.  Pulmonary:     Effort: Pulmonary effort is normal.     Breath sounds: Normal breath sounds.  Abdominal:     General: Bowel sounds are normal.     Palpations: Abdomen is soft.  Musculoskeletal:     Cervical back: Normal range of motion.  Lymphadenopathy:     Cervical: No cervical adenopathy.  Skin:    General: Skin is warm and dry.     Capillary Refill: Capillary refill takes less than 2 seconds.  Neurological:     Mental Status: She is alert and oriented to person, place, and time.  Psychiatric:        Behavior: Behavior normal.      Musculoskeletal Exam: C-spine, thoracic and lumbar spine with good range of motion.  She had some discomfort range of motion of her left shoulder joint.  Elbow joints with good range of motion.  There was no MCP or wrist joint tenderness.  She has bilateral PIP and DIP thickening with no synovitis.  Hip joints, knee joints with good range of motion.  She has bilateral pes cavus and hammertoes.  CDAI Exam: CDAI Score: -- Patient Global: --; Provider Global: -- Swollen: --; Tender: -- Joint Exam 02/26/2020   No joint exam has been documented for this visit   There is currently no information documented on the homunculus. Go to the Rheumatology activity and complete the homunculus joint exam.  Investigation: No additional findings.  Imaging: No results found.  Recent Labs: Lab Results  Component Value Date   WBC 4.1 08/20/2018   HGB 13.4 08/20/2018   PLT 196.0 08/20/2018   NA 139 08/20/2018   K 4.2 08/20/2018   CL 106 08/20/2018   CO2 27 08/20/2018   GLUCOSE 93 08/20/2018   BUN 11 08/20/2018   CREATININE 0.71 08/20/2018   BILITOT 0.6 08/20/2018   ALKPHOS 64 08/20/2018   AST 21 08/20/2018   ALT 23 08/20/2018   PROT 7.2 08/20/2018   ALBUMIN 4.4 08/20/2018   CALCIUM 9.2 08/20/2018    Speciality Comments: No specialty comments available.  Procedures:  No procedures performed Allergies: Grass extracts  [gramineae pollens] and Latex   Assessment / Plan:     Visit Diagnoses: Primary osteoarthritis of both hands - Bilateral mild osteoarthritis.  She has bilateral PIP and DIP thickening but no synovitis.  She gives history of intermittent swelling.  I did not see any synovitis on examination today.  I will obtain labs today.  I will contact her with the lab results.  She has been using natural anti-inflammatories which has been useful.  Pain in both hands - Plan: Sedimentation rate, Rheumatoid factor, Cyclic citrul peptide antibody, IgG, Uric acid  Chondromalacia of both patellae - Bilateral mild chondromalacia patella.  Knee joint exercises were discussed with the last visit.  She has been doing the exercises on a regular basis.  Primary osteoarthritis of both feet - Bilateral mild osteoarthritis.  She has bilateral hammertoes.  Use  of shoes with arch support with metatarsal pads were discussed.  Some of the feet exercises were demonstrated in the office.  Acquired bilateral pes cavus  Chronic left shoulder pain -patient states that she was evaluated by an orthopedic surgeon and was diagnosed with partial rotator cuff tear.  She has been doing exercises at home.  History of anemia - History of fibroids and heavy bleeding.  Chronic daily headache  Chronic midline low back pain without sciatica -she complains of chronic lower back pain.  She had good range of motion.  A handout on back exercises was given.  Plan: HLA-B27 antigen  She is completely vaccinated against COVID-19.  Use of mask, social distancing and hand hygiene was emphasized.  She was also advised to get a booster when available.  Orders: Orders Placed This Encounter  Procedures  . Sedimentation rate  . Rheumatoid factor  . Cyclic citrul peptide antibody, IgG  . HLA-B27 antigen  . Uric acid   No orders of the defined types were placed in this encounter.     Follow-Up Instructions: Return in about 1 year (around  02/25/2021) for Osteoarthritis.   Bo Merino, MD  Note - This record has been created using Editor, commissioning.  Chart creation errors have been sought, but may not always  have been located. Such creation errors do not reflect on  the standard of medical care.

## 2020-02-26 ENCOUNTER — Encounter: Payer: Self-pay | Admitting: Rheumatology

## 2020-02-26 ENCOUNTER — Other Ambulatory Visit: Payer: Self-pay

## 2020-02-26 ENCOUNTER — Ambulatory Visit (INDEPENDENT_AMBULATORY_CARE_PROVIDER_SITE_OTHER): Payer: BLUE CROSS/BLUE SHIELD | Admitting: Rheumatology

## 2020-02-26 VITALS — BP 117/72 | HR 54 | Resp 13 | Ht 63.0 in | Wt 160.2 lb

## 2020-02-26 DIAGNOSIS — M545 Low back pain, unspecified: Secondary | ICD-10-CM

## 2020-02-26 DIAGNOSIS — M2242 Chondromalacia patellae, left knee: Secondary | ICD-10-CM

## 2020-02-26 DIAGNOSIS — M19041 Primary osteoarthritis, right hand: Secondary | ICD-10-CM | POA: Diagnosis not present

## 2020-02-26 DIAGNOSIS — M19071 Primary osteoarthritis, right ankle and foot: Secondary | ICD-10-CM | POA: Diagnosis not present

## 2020-02-26 DIAGNOSIS — M79641 Pain in right hand: Secondary | ICD-10-CM

## 2020-02-26 DIAGNOSIS — M79642 Pain in left hand: Secondary | ICD-10-CM

## 2020-02-26 DIAGNOSIS — M19072 Primary osteoarthritis, left ankle and foot: Secondary | ICD-10-CM

## 2020-02-26 DIAGNOSIS — R519 Headache, unspecified: Secondary | ICD-10-CM

## 2020-02-26 DIAGNOSIS — M2241 Chondromalacia patellae, right knee: Secondary | ICD-10-CM | POA: Diagnosis not present

## 2020-02-26 DIAGNOSIS — G8929 Other chronic pain: Secondary | ICD-10-CM | POA: Diagnosis not present

## 2020-02-26 DIAGNOSIS — Z862 Personal history of diseases of the blood and blood-forming organs and certain disorders involving the immune mechanism: Secondary | ICD-10-CM

## 2020-02-26 DIAGNOSIS — M25512 Pain in left shoulder: Secondary | ICD-10-CM

## 2020-02-26 DIAGNOSIS — M19042 Primary osteoarthritis, left hand: Secondary | ICD-10-CM | POA: Insufficient documentation

## 2020-02-26 DIAGNOSIS — M216X2 Other acquired deformities of left foot: Secondary | ICD-10-CM

## 2020-02-26 DIAGNOSIS — M216X1 Other acquired deformities of right foot: Secondary | ICD-10-CM

## 2020-02-26 NOTE — Patient Instructions (Signed)

## 2020-02-28 LAB — HLA-B27 ANTIGEN: HLA-B27 Antigen: NEGATIVE

## 2020-02-28 LAB — URIC ACID: Uric Acid, Serum: 4.7 mg/dL (ref 2.5–7.0)

## 2020-02-28 LAB — RHEUMATOID FACTOR: Rhuematoid fact SerPl-aCnc: <14 IU/mL (ref <14)

## 2020-02-28 LAB — CYCLIC CITRUL PEPTIDE ANTIBODY, IGG: Cyclic Citrullin Peptide Ab: <16 UNITS

## 2020-02-28 LAB — SEDIMENTATION RATE: Sed Rate: 2 mm/h (ref 0–30)

## 2020-03-03 DIAGNOSIS — L821 Other seborrheic keratosis: Secondary | ICD-10-CM | POA: Diagnosis not present

## 2020-03-03 DIAGNOSIS — D1801 Hemangioma of skin and subcutaneous tissue: Secondary | ICD-10-CM | POA: Diagnosis not present

## 2020-03-03 DIAGNOSIS — L718 Other rosacea: Secondary | ICD-10-CM | POA: Diagnosis not present

## 2020-03-03 DIAGNOSIS — D2271 Melanocytic nevi of right lower limb, including hip: Secondary | ICD-10-CM | POA: Diagnosis not present

## 2020-03-03 DIAGNOSIS — D2272 Melanocytic nevi of left lower limb, including hip: Secondary | ICD-10-CM | POA: Diagnosis not present

## 2020-03-03 DIAGNOSIS — D485 Neoplasm of uncertain behavior of skin: Secondary | ICD-10-CM | POA: Diagnosis not present

## 2020-05-19 DIAGNOSIS — Z01419 Encounter for gynecological examination (general) (routine) without abnormal findings: Secondary | ICD-10-CM | POA: Diagnosis not present

## 2020-05-19 DIAGNOSIS — Z6828 Body mass index (BMI) 28.0-28.9, adult: Secondary | ICD-10-CM | POA: Diagnosis not present

## 2020-05-19 DIAGNOSIS — Z1231 Encounter for screening mammogram for malignant neoplasm of breast: Secondary | ICD-10-CM | POA: Diagnosis not present

## 2021-02-17 NOTE — Progress Notes (Deleted)
Office Visit Note  Patient: [name]             Date of Birth: [birth year]           MRN: [id number]             PCP: Aloha Gell, MD Referring: Aloha Gell, MD Visit Date: 03/03/2021 Occupation: '@GUAROCC'$ @  Subjective:  No chief complaint on file.   History of Present Illness: [name] is a 55 y.o. female ***   Activities of Daily Living:  Patient reports morning stiffness for *** {minute/hour:19697}.   Patient {ACTIONS;DENIES/REPORTS:21021675::"Denies"} nocturnal pain.  Difficulty dressing/grooming: {ACTIONS;DENIES/REPORTS:21021675::"Denies"} Difficulty climbing stairs: {ACTIONS;DENIES/REPORTS:21021675::"Denies"} Difficulty getting out of chair: {ACTIONS;DENIES/REPORTS:21021675::"Denies"} Difficulty using hands for taps, buttons, cutlery, and/or writing: {ACTIONS;DENIES/REPORTS:21021675::"Denies"}  No Rheumatology ROS completed.   PMFS History:  Patient Active Problem List   Diagnosis Date Noted   Primary osteoarthritis of both hands 02/26/2020   Chondromalacia of both patellae 02/26/2020   Primary osteoarthritis of both feet 02/26/2020   Routine general medical examination at a health care facility 08/15/2018   Chronic daily headache 10/31/2013    Past Medical History:  Diagnosis Date   ASCUS on Pap smear 02/2007   NEG HPV   Frequent headaches    Heart murmur     Family History  Problem Relation Age of Onset   Arthritis Father    Hypertension Mother    Breast cancer Maternal Grandmother    Healthy Daughter    Healthy Son    Past Surgical History:  Procedure Laterality Date   CESAREAN SECTION     HYSTEROSCOPY  03/2006   WITH D&C   TONSILLECTOMY AND ADENOIDECTOMY  1978   Social History   Social History Narrative   Married 2 children   Right handed   Currently in graduate school   3 cups daily   Immunization History  Administered Date(s) Administered   Influenza,inj,Quad PF,6+ Mos 04/12/2018   Moderna Sars-Covid-2 Vaccination  08/31/2019, 09/28/2019   Zoster Recombinat (Shingrix) 08/20/2018, 12/21/2018     Objective: Vital Signs: There were no vitals taken for this visit.   Physical Exam   Musculoskeletal Exam: ***  CDAI Exam: CDAI Score: -- Patient Global: --; Provider Global: -- Swollen: --; Tender: -- Joint Exam 03/03/2021   No joint exam has been documented for this visit   There is currently no information documented on the homunculus. Go to the Rheumatology activity and complete the homunculus joint exam.  Investigation: No additional findings.  Imaging: No results found.  Recent Labs: Lab Results  Component Value Date   WBC 4.1 08/20/2018   HGB 13.4 08/20/2018   PLT 196.0 08/20/2018   NA 139 08/20/2018   K 4.2 08/20/2018   CL 106 08/20/2018   CO2 27 08/20/2018   GLUCOSE 93 08/20/2018   BUN 11 08/20/2018   CREATININE 0.71 08/20/2018   BILITOT 0.6 08/20/2018   ALKPHOS 64 08/20/2018   AST 21 08/20/2018   ALT 23 08/20/2018   PROT 7.2 08/20/2018   ALBUMIN 4.4 08/20/2018   CALCIUM 9.2 08/20/2018    Speciality Comments: No specialty comments available.  Procedures:  No procedures performed Allergies: Grass extracts [gramineae pollens] and Latex   Assessment / Plan:     Visit Diagnoses: No diagnosis found.  Orders: No orders of the defined types were placed in this encounter.  No orders of the defined types were placed in this encounter.   Face-to-face time spent with patient was *** minutes. Greater than 50% of time  was spent in counseling and coordination of care.  Follow-Up Instructions: No follow-ups on file.   Earnestine Mealing, CMA  Note - This record has been created using Editor, commissioning.  Chart creation errors have been sought, but may not always  have been located. Such creation errors do not reflect on  the standard of medical care.

## 2021-03-03 ENCOUNTER — Ambulatory Visit: Payer: BLUE CROSS/BLUE SHIELD | Admitting: Rheumatology

## 2021-03-03 DIAGNOSIS — G8929 Other chronic pain: Secondary | ICD-10-CM

## 2021-03-03 DIAGNOSIS — M79641 Pain in right hand: Secondary | ICD-10-CM

## 2021-03-03 DIAGNOSIS — M19071 Primary osteoarthritis, right ankle and foot: Secondary | ICD-10-CM

## 2021-03-03 DIAGNOSIS — M19041 Primary osteoarthritis, right hand: Secondary | ICD-10-CM

## 2021-03-03 DIAGNOSIS — M216X2 Other acquired deformities of left foot: Secondary | ICD-10-CM

## 2021-03-03 DIAGNOSIS — Z862 Personal history of diseases of the blood and blood-forming organs and certain disorders involving the immune mechanism: Secondary | ICD-10-CM

## 2021-03-03 DIAGNOSIS — M2241 Chondromalacia patellae, right knee: Secondary | ICD-10-CM

## 2021-03-03 DIAGNOSIS — R519 Headache, unspecified: Secondary | ICD-10-CM

## 2021-03-22 DIAGNOSIS — N912 Amenorrhea, unspecified: Secondary | ICD-10-CM | POA: Diagnosis not present

## 2021-03-22 DIAGNOSIS — R110 Nausea: Secondary | ICD-10-CM | POA: Diagnosis not present

## 2021-03-22 DIAGNOSIS — R197 Diarrhea, unspecified: Secondary | ICD-10-CM | POA: Diagnosis not present

## 2021-04-13 DIAGNOSIS — L209 Atopic dermatitis, unspecified: Secondary | ICD-10-CM | POA: Diagnosis not present

## 2021-04-13 DIAGNOSIS — L814 Other melanin hyperpigmentation: Secondary | ICD-10-CM | POA: Diagnosis not present

## 2021-04-13 DIAGNOSIS — D225 Melanocytic nevi of trunk: Secondary | ICD-10-CM | POA: Diagnosis not present

## 2021-04-13 DIAGNOSIS — D2239 Melanocytic nevi of other parts of face: Secondary | ICD-10-CM | POA: Diagnosis not present

## 2021-04-27 DIAGNOSIS — Z780 Asymptomatic menopausal state: Secondary | ICD-10-CM | POA: Diagnosis not present

## 2021-05-17 DIAGNOSIS — M5451 Vertebrogenic low back pain: Secondary | ICD-10-CM | POA: Diagnosis not present

## 2021-05-19 DIAGNOSIS — M5451 Vertebrogenic low back pain: Secondary | ICD-10-CM | POA: Diagnosis not present

## 2021-05-24 DIAGNOSIS — Z1231 Encounter for screening mammogram for malignant neoplasm of breast: Secondary | ICD-10-CM | POA: Diagnosis not present

## 2021-05-24 DIAGNOSIS — Z6828 Body mass index (BMI) 28.0-28.9, adult: Secondary | ICD-10-CM | POA: Diagnosis not present

## 2021-05-24 DIAGNOSIS — Z01419 Encounter for gynecological examination (general) (routine) without abnormal findings: Secondary | ICD-10-CM | POA: Diagnosis not present

## 2021-05-27 DIAGNOSIS — M5451 Vertebrogenic low back pain: Secondary | ICD-10-CM | POA: Diagnosis not present

## 2021-06-10 DIAGNOSIS — M5459 Other low back pain: Secondary | ICD-10-CM | POA: Diagnosis not present

## 2021-06-10 DIAGNOSIS — M5451 Vertebrogenic low back pain: Secondary | ICD-10-CM | POA: Diagnosis not present

## 2021-06-18 DIAGNOSIS — M5416 Radiculopathy, lumbar region: Secondary | ICD-10-CM | POA: Diagnosis not present

## 2021-06-29 DIAGNOSIS — M5451 Vertebrogenic low back pain: Secondary | ICD-10-CM | POA: Diagnosis not present

## 2021-07-08 DIAGNOSIS — M545 Low back pain, unspecified: Secondary | ICD-10-CM | POA: Diagnosis not present

## 2021-07-20 DIAGNOSIS — M5451 Vertebrogenic low back pain: Secondary | ICD-10-CM | POA: Diagnosis not present

## 2021-07-21 DIAGNOSIS — M545 Low back pain, unspecified: Secondary | ICD-10-CM | POA: Diagnosis not present

## 2021-11-02 DIAGNOSIS — M25512 Pain in left shoulder: Secondary | ICD-10-CM | POA: Diagnosis not present

## 2021-11-02 DIAGNOSIS — M25511 Pain in right shoulder: Secondary | ICD-10-CM | POA: Diagnosis not present

## 2021-11-08 DIAGNOSIS — H532 Diplopia: Secondary | ICD-10-CM | POA: Diagnosis not present

## 2021-11-08 DIAGNOSIS — H40033 Anatomical narrow angle, bilateral: Secondary | ICD-10-CM | POA: Diagnosis not present

## 2021-11-08 DIAGNOSIS — H40013 Open angle with borderline findings, low risk, bilateral: Secondary | ICD-10-CM | POA: Diagnosis not present

## 2021-12-09 NOTE — Progress Notes (Unsigned)
Office Visit Note  Patient: [name]             Date of Birth: 05-02-1966           MRN: [id number]             PCP: Aloha Gell, MD Referring: Aloha Gell, MD Visit Date: 12/14/2021 Occupation: @GUAROCC @  Subjective:  Pain in multiple joints  History of Present Illness: Jerzee Jerome is a 56 y.o. female with history of osteoarthritis, she returns today after her last visit on February 26, 2020.  She states she continues to have some pain and stiffness in her bilateral hands.  She also has discomfort in her bilateral shoulders.  She states she was seen by an orthopedic surgeon who told her that she has rotator cuff tear in her left shoulder joint.  She went for physical therapy.  She lifts weights which causes increased discomfort in her shoulders.  She has been having pain and discomfort in her right knee joint for some time.  She states she recently traveled to [place] and after that she has been having increased pain and discomfort in her right knee joint.  She has lower back pain off and on.  Activities of Daily Living:  Patient reports morning stiffness for 2 minutes.   Patient Denies nocturnal pain.  Difficulty dressing/grooming: Denies Difficulty climbing stairs: Denies Difficulty getting out of chair: Denies Difficulty using hands for taps, buttons, cutlery, and/or writing: Denies  Review of Systems  Constitutional:  Negative for fatigue and night sweats.  HENT:  Positive for mouth sores. Negative for mouth dryness.   Eyes:  Negative for dryness.  Respiratory:  Negative for shortness of breath and difficulty breathing.   Cardiovascular:  Negative for chest pain and palpitations.  Gastrointestinal:  Positive for diarrhea. Negative for blood in stool and constipation.  Endocrine: Negative for increased urination.  Genitourinary:  Negative for difficulty urinating and involuntary urination.  Musculoskeletal:  Positive for joint pain, joint pain and morning  stiffness. Negative for joint swelling, myalgias and myalgias.  Skin:  Positive for sensitivity to sunlight. Negative for color change and rash.  Allergic/Immunologic: Negative for susceptible to infections.  Neurological:  Negative for dizziness and night sweats.  Hematological:  Negative for swollen glands.  Psychiatric/Behavioral:  Negative for depressed mood and sleep disturbance. The patient is not nervous/anxious.     PMFS History:  Patient Active Problem List   Diagnosis Date Noted   Primary osteoarthritis of both hands 02/26/2020   Chondromalacia of both patellae 02/26/2020   Primary osteoarthritis of both feet 02/26/2020   Routine general medical examination at a health care facility 08/15/2018   Chronic daily headache 10/31/2013    Past Medical History:  Diagnosis Date   ASCUS on Pap smear 02/2007   NEG HPV   Frequent headaches    Heart murmur    Osteoarthritis    Rotator cuff tear     Family History  Problem Relation Age of Onset   Arthritis Father    Hypertension Mother    Breast cancer Maternal Grandmother    Healthy Daughter    Healthy Son    Past Surgical History:  Procedure Laterality Date   CESAREAN SECTION     HYSTEROSCOPY  03/2006   WITH D&C   TONSILLECTOMY AND ADENOIDECTOMY  1978   Social History   Social History Narrative   Married 2 children   Right handed   Currently in graduate school   3 cups daily  Immunization History  Administered Date(s) Administered   Influenza,inj,Quad PF,6+ Mos 04/12/2018   Moderna Sars-Covid-2 Vaccination 08/31/2019, 09/28/2019   Zoster Recombinat (Shingrix) 08/20/2018, 12/21/2018     Objective: Vital Signs: BP 118/69 (BP Location: Left Arm, Patient Position: Sitting, Cuff Size: Normal)   Pulse (!) 48   Resp 15   Ht $R'5\' 3"'uh$  (1.6 m)   Wt 156 lb (70.8 kg)   BMI 27.63 kg/m    Physical Exam Vitals and nursing note reviewed.  Constitutional:      Appearance: She is well-developed.  HENT:     Head:  Normocephalic and atraumatic.  Eyes:     Conjunctiva/sclera: Conjunctivae normal.  Cardiovascular:     Rate and Rhythm: Normal rate and regular rhythm.     Heart sounds: Normal heart sounds.  Pulmonary:     Effort: Pulmonary effort is normal.     Breath sounds: Normal breath sounds.  Abdominal:     General: Bowel sounds are normal.     Palpations: Abdomen is soft.  Musculoskeletal:     Cervical back: Normal range of motion.  Lymphadenopathy:     Cervical: No cervical adenopathy.  Skin:    General: Skin is warm and dry.     Capillary Refill: Capillary refill takes less than 2 seconds.  Neurological:     Mental Status: She is alert and oriented to person, place, and time.  Psychiatric:        Behavior: Behavior normal.      Musculoskeletal Exam: C-spine was in good range of motion.  She had good range of motion of bilateral shoulders with some discomfort.  Elbow joints and wrist joints with good range of motion.  She had bilateral PIP and DIP thickening with no synovitis.  Hip joints in good range of motion.  Knee joints in good range of motion.  Warmth was noted on palpation of her right knee joint.  There was no tenderness over ankles or MTPs.  Bilateral bunions were noted.  CDAI Exam: CDAI Score: -- Patient Global: --; Provider Global: -- Swollen: --; Tender: -- Joint Exam 12/14/2021   No joint exam has been documented for this visit   There is currently no information documented on the homunculus. Go to the Rheumatology activity and complete the homunculus joint exam.  Investigation: No additional findings.  Imaging: No results found.  Recent Labs: Lab Results  Component Value Date   WBC 4.1 08/20/2018   HGB 13.4 08/20/2018   PLT 196.0 08/20/2018   NA 139 08/20/2018   K 4.2 08/20/2018   CL 106 08/20/2018   CO2 27 08/20/2018   GLUCOSE 93 08/20/2018   BUN 11 08/20/2018   CREATININE 0.71 08/20/2018   BILITOT 0.6 08/20/2018   ALKPHOS 64 08/20/2018   AST 21  08/20/2018   ALT 23 08/20/2018   PROT 7.2 08/20/2018   ALBUMIN 4.4 08/20/2018   CALCIUM 9.2 08/20/2018    Speciality Comments: No specialty comments available.  Procedures:  No procedures performed Allergies: Grass extracts [gramineae pollens] and Latex   Assessment / Plan:     Visit Diagnoses: Chronic left shoulder pain - Diagnosed as partial rotator cuff tear by an orthopedic surgeon.  She states she continues to have some discomfort in her left shoulder joint.  She also has discomfort in her right shoulder joint.  She had physical therapy.  Primary osteoarthritis of both hands - February 26, 2020 ESR 2, RF negative, anti-CCP negative, uric acid 4.7, HLA-B27 negative.  Clinical findings  and radiographic findings were consistent with osteoarthritis.  She had bilateral PIP and DIP thickening with no synovitis.  Chronic pain of right knee -she has been experiencing discomfort in her right knee joint for the last few months.  She states the pain got exacerbated after her trip to [place] couple of weeks ago.  She had warmth on palpation of her right knee joint.  No effusion was noted.  I offered cortisone injection but she declined.  She will try over-the-counter ibuprofen for now.  Plan: XR KNEE 3 VIEW RIGHT.  X-ray showed mild chondromalacia patella without any radiographic progression when compared to the x-rays of 2020.  X-ray findings were discussed with the patient.  Advised her to contact me if her symptoms get worse or do not improve.  Chondromalacia of both patellae  Primary osteoarthritis of both feet-she has osteoarthritis in her bilateral feet.  Proper fitting shoes were discussed.  Acquired bilateral pes cavus-arch support was advised.  Chronic midline low back pain without sciatica-she has intermittent discomfort in her lower back without any radiculopathy.  History of anemia  Chronic daily headache  Orders: Orders Placed This Encounter  Procedures   XR KNEE 3 VIEW  RIGHT   No orders of the defined types were placed in this encounter.   Follow-Up Instructions: Return for Osteoarthritis.   Bo Merino, MD  Note - This record has been created using Editor, commissioning.  Chart creation errors have been sought, but may not always  have been located. Such creation errors do not reflect on  the standard of medical care.

## 2021-12-14 ENCOUNTER — Ambulatory Visit (INDEPENDENT_AMBULATORY_CARE_PROVIDER_SITE_OTHER): Payer: BC Managed Care – PPO

## 2021-12-14 ENCOUNTER — Ambulatory Visit (INDEPENDENT_AMBULATORY_CARE_PROVIDER_SITE_OTHER): Payer: BC Managed Care – PPO | Admitting: Rheumatology

## 2021-12-14 ENCOUNTER — Encounter: Payer: Self-pay | Admitting: Rheumatology

## 2021-12-14 VITALS — BP 118/69 | HR 48 | Resp 15 | Ht 63.0 in | Wt 156.0 lb

## 2021-12-14 DIAGNOSIS — M25561 Pain in right knee: Secondary | ICD-10-CM

## 2021-12-14 DIAGNOSIS — M545 Low back pain, unspecified: Secondary | ICD-10-CM

## 2021-12-14 DIAGNOSIS — M19041 Primary osteoarthritis, right hand: Secondary | ICD-10-CM

## 2021-12-14 DIAGNOSIS — Z862 Personal history of diseases of the blood and blood-forming organs and certain disorders involving the immune mechanism: Secondary | ICD-10-CM

## 2021-12-14 DIAGNOSIS — M216X1 Other acquired deformities of right foot: Secondary | ICD-10-CM

## 2021-12-14 DIAGNOSIS — M216X2 Other acquired deformities of left foot: Secondary | ICD-10-CM

## 2021-12-14 DIAGNOSIS — M2242 Chondromalacia patellae, left knee: Secondary | ICD-10-CM

## 2021-12-14 DIAGNOSIS — G8929 Other chronic pain: Secondary | ICD-10-CM

## 2021-12-14 DIAGNOSIS — M19071 Primary osteoarthritis, right ankle and foot: Secondary | ICD-10-CM

## 2021-12-14 DIAGNOSIS — M2241 Chondromalacia patellae, right knee: Secondary | ICD-10-CM | POA: Diagnosis not present

## 2021-12-14 DIAGNOSIS — M25512 Pain in left shoulder: Secondary | ICD-10-CM

## 2021-12-14 DIAGNOSIS — M19042 Primary osteoarthritis, left hand: Secondary | ICD-10-CM

## 2021-12-14 DIAGNOSIS — M19072 Primary osteoarthritis, left ankle and foot: Secondary | ICD-10-CM

## 2021-12-14 DIAGNOSIS — R519 Headache, unspecified: Secondary | ICD-10-CM

## 2021-12-14 NOTE — Patient Instructions (Signed)
Knee Exercises Ask your health care provider which exercises are safe for you. Do exercises exactly as told by your health care provider and adjust them as directed. It is normal to feel mild stretching, pulling, tightness, or discomfort as you do these exercises. Stop right away if you feel sudden pain or your pain gets worse. Do not begin these exercises until told by your health care provider. Stretching and range-of-motion exercises These exercises warm up your muscles and joints and improve the movement and flexibility of your knee. These exercises also help to relieve pain and swelling. Knee extension, prone  Lie on your abdomen (prone position) on a bed. Place your left / right knee just beyond the edge of the surface so your knee is not on the bed. You can put a towel under your left / right thigh just above your kneecap for comfort. Relax your leg muscles and allow gravity to straighten your knee (extension). You should feel a stretch behind your left / right knee. Hold this position for __________ seconds. Scoot up so your knee is supported between repetitions. Repeat __________ times. Complete this exercise __________ times a day. Knee flexion, active  Lie on your back with both legs straight. If this causes back discomfort, bend your left / right knee so your foot is flat on the floor. Slowly slide your left / right heel back toward your buttocks. Stop when you feel a gentle stretch in the front of your knee or thigh (flexion). Hold this position for __________ seconds. Slowly slide your left / right heel back to the starting position. Repeat __________ times. Complete this exercise __________ times a day. Quadriceps stretch, prone  Lie on your abdomen on a firm surface, such as a bed or padded floor. Bend your left / right knee and hold your ankle. If you cannot reach your ankle or pant leg, loop a belt around your foot and grab the belt instead. Gently pull your heel toward your  buttocks. Your knee should not slide out to the side. You should feel a stretch in the front of your thigh and knee (quadriceps). Hold this position for __________ seconds. Repeat __________ times. Complete this exercise __________ times a day. Hamstring, supine  Lie on your back (supine position). Loop a belt or towel over the ball of your left / right foot. The ball of your foot is on the walking surface, right under your toes. Straighten your left / right knee and slowly pull on the belt to raise your leg until you feel a gentle stretch behind your knee (hamstring). Do not let your knee bend while you do this. Keep your other leg flat on the floor. Hold this position for __________ seconds. Repeat __________ times. Complete this exercise __________ times a day. Strengthening exercises These exercises build strength and endurance in your knee. Endurance is the ability to use your muscles for a long time, even after they get tired. Quadriceps, isometric This exercise strengthens the muscles in front of your thigh (quadriceps) without moving your knee joint (isometric). Lie on your back with your left / right leg extended and your other knee bent. Put a rolled towel or small pillow under your knee if told by your health care provider. Slowly tense the muscles in the front of your left / right thigh. You should see your kneecap slide up toward your hip or see increased dimpling just above the knee. This motion will push the back of the knee toward the floor.   For __________ seconds, hold the muscle as tight as you can without increasing your pain. Relax the muscles slowly and completely. Repeat __________ times. Complete this exercise __________ times a day. Straight leg raises This exercise strengthens the muscles in front of your thigh (quadriceps) and the muscles that move your hips (hip flexors). Lie on your back with your left / right leg extended and your other knee bent. Tense the  muscles in the front of your left / right thigh. You should see your kneecap slide up or see increased dimpling just above the knee. Your thigh may even shake a bit. Keep these muscles tight as you raise your leg 4-6 inches (10-15 cm) off the floor. Do not let your knee bend. Hold this position for __________ seconds. Keep these muscles tense as you lower your leg. Relax your muscles slowly and completely after each repetition. Repeat __________ times. Complete this exercise __________ times a day. Hamstring, isometric  Lie on your back on a firm surface. Bend your left / right knee about __________ degrees. Dig your left / right heel into the surface as if you are trying to pull it toward your buttocks. Tighten the muscles in the back of your thighs (hamstring) to "dig" as hard as you can without increasing any pain. Hold this position for __________ seconds. Release the tension gradually and allow your muscles to relax completely for __________ seconds after each repetition. Repeat __________ times. Complete this exercise __________ times a day. Hamstring curls If told by your health care provider, do this exercise while wearing ankle weights. Begin with __________lb / kg weights. Then increase the weight by 1 lb (0.5 kg) increments. Do not wear ankle weights that are more than __________lb / kg. Lie on your abdomen with your legs straight. Bend your left / right knee as far as you can without feeling pain. Keep your hips flat against the floor. Hold this position for __________ seconds. Slowly lower your leg to the starting position. Repeat __________ times. Complete this exercise __________ times a day. Squats This exercise strengthens the muscles in front of your thigh and knee (quadriceps). Stand in front of a table, with your feet and knees pointing straight ahead. You may rest your hands on the table for balance but not for support. Slowly bend your knees and lower your hips like you  are going to sit in a chair. Keep your weight over your heels, not over your toes. Keep your lower legs upright so they are parallel with the table legs. Do not let your hips go lower than your knees. Do not bend lower than told by your health care provider. If your knee pain increases, do not bend as low. Hold the squat position for __________ seconds. Slowly push with your legs to return to standing. Do not use your hands to pull yourself to standing. Repeat __________ times. Complete this exercise __________ times a day. Wall slides This exercise strengthens the muscles in front of your thigh and knee (quadriceps). Lean your back against a smooth wall or door, and walk your feet out 18-24 inches (46-61 cm) from it. Place your feet hip-width apart. Slowly slide down the wall or door until your knees bend __________ degrees. Keep your knees over your heels, not over your toes. Keep your knees in line with your hips. Hold this position for __________ seconds. Repeat __________ times. Complete this exercise __________ times a day. Straight leg raises, side-lying This exercise strengthens the muscles that rotate   the leg at the hip and move it away from your body (hip abductors). Lie on your side with your left / right leg in the top position. Lie so your head, shoulder, knee, and hip line up. You may bend your bottom knee to help you keep your balance. Roll your hips slightly forward so your hips are stacked directly over each other and your left / right knee is facing forward. Leading with your heel, lift your top leg 4-6 inches (10-15 cm). You should feel the muscles in your outer hip lifting. Do not let your foot drift forward. Do not let your knee roll toward the ceiling. Hold this position for __________ seconds. Slowly return your leg to the starting position. Let your muscles relax completely after each repetition. Repeat __________ times. Complete this exercise __________ times a  day. Straight leg raises, prone This exercise stretches the muscles that move your hips away from the front of the pelvis (hip extensors). Lie on your abdomen on a firm surface. You can put a pillow under your hips if that is more comfortable. Tense the muscles in your buttocks and lift your left / right leg about 4-6 inches (10-15 cm). Keep your knee straight as you lift your leg. Hold this position for __________ seconds. Slowly lower your leg to the starting position. Let your leg relax completely after each repetition. Repeat __________ times. Complete this exercise __________ times a day. This information is not intended to replace advice given to you by your health care provider. Make sure you discuss any questions you have with your health care provider. Document Revised: 02/23/2021 Document Reviewed: 02/23/2021 Elsevier Patient Education  2023 [employer] Inc.  

## 2022-03-10 DIAGNOSIS — D2271 Melanocytic nevi of right lower limb, including hip: Secondary | ICD-10-CM | POA: Diagnosis not present

## 2022-03-10 DIAGNOSIS — L814 Other melanin hyperpigmentation: Secondary | ICD-10-CM | POA: Diagnosis not present

## 2022-03-10 DIAGNOSIS — D2272 Melanocytic nevi of left lower limb, including hip: Secondary | ICD-10-CM | POA: Diagnosis not present

## 2022-03-10 DIAGNOSIS — D235 Other benign neoplasm of skin of trunk: Secondary | ICD-10-CM | POA: Diagnosis not present

## 2022-04-26 DIAGNOSIS — Z79899 Other long term (current) drug therapy: Secondary | ICD-10-CM | POA: Diagnosis not present

## 2022-04-26 DIAGNOSIS — Z131 Encounter for screening for diabetes mellitus: Secondary | ICD-10-CM | POA: Diagnosis not present

## 2022-04-26 DIAGNOSIS — Z1329 Encounter for screening for other suspected endocrine disorder: Secondary | ICD-10-CM | POA: Diagnosis not present

## 2022-04-26 DIAGNOSIS — Z13220 Encounter for screening for lipoid disorders: Secondary | ICD-10-CM | POA: Diagnosis not present

## 2022-04-26 DIAGNOSIS — Z0001 Encounter for general adult medical examination with abnormal findings: Secondary | ICD-10-CM | POA: Diagnosis not present

## 2022-04-26 DIAGNOSIS — Z1321 Encounter for screening for nutritional disorder: Secondary | ICD-10-CM | POA: Diagnosis not present

## 2022-05-27 DIAGNOSIS — Z13220 Encounter for screening for lipoid disorders: Secondary | ICD-10-CM | POA: Diagnosis not present

## 2022-05-27 DIAGNOSIS — E781 Pure hyperglyceridemia: Secondary | ICD-10-CM | POA: Diagnosis not present

## 2022-05-27 DIAGNOSIS — Z0001 Encounter for general adult medical examination with abnormal findings: Secondary | ICD-10-CM | POA: Diagnosis not present

## 2022-05-27 DIAGNOSIS — Z79899 Other long term (current) drug therapy: Secondary | ICD-10-CM | POA: Diagnosis not present

## 2022-05-27 DIAGNOSIS — Z131 Encounter for screening for diabetes mellitus: Secondary | ICD-10-CM | POA: Diagnosis not present

## 2022-06-07 DIAGNOSIS — Z01419 Encounter for gynecological examination (general) (routine) without abnormal findings: Secondary | ICD-10-CM | POA: Diagnosis not present

## 2022-06-07 DIAGNOSIS — Z6827 Body mass index (BMI) 27.0-27.9, adult: Secondary | ICD-10-CM | POA: Diagnosis not present

## 2022-06-07 DIAGNOSIS — Z1231 Encounter for screening mammogram for malignant neoplasm of breast: Secondary | ICD-10-CM | POA: Diagnosis not present

## 2022-06-07 DIAGNOSIS — Z124 Encounter for screening for malignant neoplasm of cervix: Secondary | ICD-10-CM | POA: Diagnosis not present

## 2022-10-04 DIAGNOSIS — M545 Low back pain, unspecified: Secondary | ICD-10-CM | POA: Diagnosis not present

## 2022-10-11 DIAGNOSIS — M545 Low back pain, unspecified: Secondary | ICD-10-CM | POA: Diagnosis not present

## 2022-11-10 DIAGNOSIS — H532 Diplopia: Secondary | ICD-10-CM | POA: Diagnosis not present

## 2022-11-10 DIAGNOSIS — H5111 Convergence insufficiency: Secondary | ICD-10-CM | POA: Diagnosis not present

## 2023-01-06 DIAGNOSIS — R197 Diarrhea, unspecified: Secondary | ICD-10-CM | POA: Diagnosis not present

## 2023-01-20 DIAGNOSIS — K6389 Other specified diseases of intestine: Secondary | ICD-10-CM | POA: Diagnosis not present

## 2023-01-20 DIAGNOSIS — R197 Diarrhea, unspecified: Secondary | ICD-10-CM | POA: Diagnosis not present

## 2023-01-23 DIAGNOSIS — R197 Diarrhea, unspecified: Secondary | ICD-10-CM | POA: Diagnosis not present

## 2023-01-23 DIAGNOSIS — B9681 Helicobacter pylori [H. pylori] as the cause of diseases classified elsewhere: Secondary | ICD-10-CM | POA: Diagnosis not present

## 2023-01-24 NOTE — Progress Notes (Unsigned)
HPI: [name] is a 57 y.o. female, who is here today to establish care.  Former PCP: Dr Ernestina Penna. Last preventive routine visit: 04/26/22.  She reports a history of osteoarthritis, seasonal allergies, heart murmur, and migraines. She denies any history of HLD, HTN, or DM.    Lipid Panel With LDL/HDL Ratio Specimen: Blood Component Ref Range & Units 9 mo ago Comments  Cholesterol, Total 100 - 199 mg/dL 454   Triglycerides 0 - 149 mg/dL 098 High    HDL >11 mg/dL 63   VLDL Cholesterol Cal 5 - 40 mg/dL 30   LDL 0 - 99 mg/dL 914 High    LDL/HDL Ratio 0.0 - 3.2 ratio 1.6    She sleeps approximately eight hours per night and exercises regularly, engaging in cross-training. She recently had a colonoscopy on 01/20/23, with normal results and a 10-year follow-up recommendation.  Polyarthralgias and chronic back pain, she follows with rheumatologist, Dr. Corliss Skains.  She occasionally takes a muscle relaxer for her back pain, flexeril, and Fioricet for her migraines, which are diagnosed as cluster headaches and triggered by bright lights. She has a history of GI issues, including frequent diarrhea, about one stool per day.  She is a vegetarian and tries to maintain a healthy diet.  Review of Systems  Constitutional:  Negative for activity change, appetite change and fever.  HENT:  Negative for nosebleeds, sore throat and trouble swallowing.   Eyes:  Negative for redness and visual disturbance.  Respiratory:  Negative for cough, shortness of breath and wheezing.   Cardiovascular:  Negative for chest pain, palpitations and leg swelling.  Gastrointestinal:  Negative for abdominal pain, nausea and vomiting.       Negative for changes in bowel habits.  Genitourinary:  Negative for decreased urine volume, difficulty urinating, dysuria and hematuria.  Musculoskeletal:  Positive for arthralgias.  Neurological:  Negative for syncope, facial asymmetry and weakness.   Psychiatric/Behavioral:  Negative for confusion. The patient is not nervous/anxious.   See other pertinent positives and negatives in HPI.  Current Outpatient Medications on File Prior to Visit  Medication Sig Dispense Refill   valACYclovir (VALTREX) 500 MG tablet as needed.     No current facility-administered medications on file prior to visit.   Past Medical History:  Diagnosis Date   ASCUS on Pap smear 02/2007   NEG HPV   Frequent headaches    Heart murmur    Osteoarthritis    Rotator cuff tear    Allergies  Allergen Reactions   Grass Extracts [Gramineae Pollens]    Latex Itching    Family History  Problem Relation Age of Onset   Arthritis Father    Hypertension Mother    Breast cancer Maternal Grandmother    Healthy Daughter    Healthy Son     Social History   Socioeconomic History   Marital status: Married    Spouse name: Not on file   Number of children: Not on file   Years of education: Not on file   Highest education level: Not on file  Occupational History   Not on file  Tobacco Use   Smoking status: Never   Smokeless tobacco: Never  Vaping Use   Vaping status: Never Used  Substance and Sexual Activity   Alcohol use: Yes    Comment: OCCASSIONALLY   Drug use: No   Sexual activity: Yes    Birth control/protection: Other-see comments    Comment: VASECTOMY  Other Topics Concern   Not  on file  Social History Narrative   Married 2 children   Right handed   Currently in graduate school   3 cups daily   Social Determinants of Health   Financial Resource Strain: Not on file  Food Insecurity: No Food Insecurity (04/26/2022)   Received from [facility]   Hunger Vital Sign    Worried About Running Out of Food in the Last Year: Never true    Ran Out of Food in the Last Year: Never true  Transportation Needs: Not on file  Physical Activity: Not on file  Stress: Not on file  Social Connections: Unknown (04/04/2022)   Received from [facility]    Social Network    Social Network: Not on file   Vitals:   01/25/23 0952  BP: 120/70  Pulse: 70  Resp: 16  Temp: 98.8 F (37.1 C)  SpO2: 98%   Body mass index is 26.59 kg/m.  Physical Exam Vitals and nursing note reviewed.  Constitutional:      General: She is not in acute distress.    Appearance: She is well-developed.  HENT:     Head: Normocephalic and atraumatic.     Mouth/Throat:     Mouth: Mucous membranes are moist.     Pharynx: Oropharynx is clear.  Eyes:     Conjunctiva/sclera: Conjunctivae normal.  Cardiovascular:     Rate and Rhythm: Normal rate and regular rhythm.     Pulses:          Dorsalis pedis pulses are 2+ on the right side and 2+ on the left side.     Heart sounds: No murmur heard. Pulmonary:     Effort: Pulmonary effort is normal. No respiratory distress.     Breath sounds: Normal breath sounds.  Abdominal:     Palpations: Abdomen is soft. There is no hepatomegaly or mass.     Tenderness: There is no abdominal tenderness.  Lymphadenopathy:     Cervical: No cervical adenopathy.  Skin:    General: Skin is warm.     Findings: No erythema or rash.  Neurological:     General: No focal deficit present.     Mental Status: She is alert and oriented to person, place, and time.     Cranial Nerves: No cranial nerve deficit.     Gait: Gait normal.  Psychiatric:        Mood and Affect: Mood and affect normal.   ASSESSMENT AND PLAN:  Ms. [name] was seen today for establish care.  Diagnoses and all orders for this visit:  Polyarthralgia OA most likely. Reviewed Dx,prognosis,and treatment. OTC Naproxen 220 mg or Ibuprofen 200-400 mg daily as needed. Follows with rheumatologist.  Chronic diarrhea It seems to be mild. Normal colonoscopy. TCA may help but because one stool per day, recommend continue non pharmacologic treatment.  Chronic nonintractable headache, unspecified headache type  Currently on Fioricet daily prn, followed by  rheumatologist.  Return in about 1 year (around 01/25/2024) for CPE.  Jefferey Lippmann G. [place], MD  [facility]. [place] office.

## 2023-01-25 ENCOUNTER — Encounter: Payer: Self-pay | Admitting: Family Medicine

## 2023-01-25 ENCOUNTER — Ambulatory Visit (INDEPENDENT_AMBULATORY_CARE_PROVIDER_SITE_OTHER): Payer: BC Managed Care – PPO | Admitting: Family Medicine

## 2023-01-25 VITALS — BP 120/70 | HR 70 | Temp 98.8°F | Resp 16 | Ht 63.0 in | Wt 150.1 lb

## 2023-01-25 DIAGNOSIS — M2550 Pain in unspecified joint: Secondary | ICD-10-CM | POA: Diagnosis not present

## 2023-01-25 DIAGNOSIS — R519 Headache, unspecified: Secondary | ICD-10-CM

## 2023-01-25 DIAGNOSIS — G8929 Other chronic pain: Secondary | ICD-10-CM | POA: Diagnosis not present

## 2023-01-25 DIAGNOSIS — K529 Noninfective gastroenteritis and colitis, unspecified: Secondary | ICD-10-CM

## 2023-01-25 NOTE — Patient Instructions (Addendum)
A few things to remember from today's visit:  Polyarthralgia  Chronic diarrhea  I was nice to meet you! I can see you back in a year.  Do not use My Chart to request refills or for acute issues that need immediate attention. If you send a my chart message, it may take a few days to be addressed, specially if I am not in the office.  Please be sure medication list is accurate. If a new problem present, please set up appointment sooner than planned today.

## 2023-02-16 ENCOUNTER — Ambulatory Visit (INDEPENDENT_AMBULATORY_CARE_PROVIDER_SITE_OTHER): Payer: BC Managed Care – PPO | Admitting: Family Medicine

## 2023-02-16 ENCOUNTER — Encounter: Payer: Self-pay | Admitting: Family Medicine

## 2023-02-16 VITALS — BP 118/72 | HR 64 | Temp 98.2°F | Ht 63.0 in | Wt 149.2 lb

## 2023-02-16 DIAGNOSIS — R0982 Postnasal drip: Secondary | ICD-10-CM | POA: Diagnosis not present

## 2023-02-16 DIAGNOSIS — J0100 Acute maxillary sinusitis, unspecified: Secondary | ICD-10-CM | POA: Diagnosis not present

## 2023-02-16 MED ORDER — AMOXICILLIN-POT CLAVULANATE 500-125 MG PO TABS: 1.0000 | ORAL_TABLET | Freq: Two times a day (BID) | ORAL | 0 refills | Status: AC

## 2023-02-16 NOTE — Progress Notes (Signed)
Established Patient Office Visit   Subjective  Patient ID: [name], female    DOB: Jun 14, 1966  Age: 57 y.o. MRN: [id number]  Chief Complaint  Patient presents with   Cough    Congestion, coughing, Headache, x 10 days, taking nightquil    Patient is a 57 year old female followed by Dr. [place] and seen for acute concern.  Patient endorses nasal congestion, productive cough, mild headache, ears feeling clogged, rhinorrhea, sore throat x 10 days.  Patient denies fever, chills, nausea, vomiting, diarrhea.  Patient tried OTC DayQuil NyQuil for symptoms without relief.  Sick contacts include patient's father-in-law.  Patient's husband is not sick with similar symptoms.  Cough    Past Medical History:  Diagnosis Date   ASCUS on Pap smear 02/2007   NEG HPV   Frequent headaches    Heart murmur    Osteoarthritis    Rotator cuff tear    Past Surgical History:  Procedure Laterality Date   CESAREAN SECTION     HYSTEROSCOPY  03/2006   WITH D&C   TONSILLECTOMY AND ADENOIDECTOMY  1978   Social History   Tobacco Use   Smoking status: Never   Smokeless tobacco: Never  Vaping Use   Vaping status: Never Used  Substance Use Topics   Alcohol use: Yes    Comment: OCCASSIONALLY   Drug use: No   Family History  Problem Relation Age of Onset   Arthritis Father    Hypertension Mother    Breast cancer Maternal Grandmother    Healthy Daughter    Healthy Son    Allergies  Allergen Reactions   Grass Extracts [Gramineae Pollens]    Latex Itching      Review of Systems  Respiratory:  Positive for cough.    Negative unless stated above    Objective:     BP 118/72 (BP Location: Left Arm, Patient Position: Sitting, Cuff Size: Normal)   Pulse 64   Temp 98.2 F (36.8 C) (Oral)   Ht 5\' 3"  (1.6 m)   Wt 149 lb 3.2 oz (67.7 kg)   SpO2 98%   BMI 26.43 kg/m  BP Readings from Last 3 Encounters:  02/16/23 118/72  01/25/23 120/70  12/14/21 118/69   Wt Readings from Last 3  Encounters:  02/16/23 149 lb 3.2 oz (67.7 kg)  01/25/23 150 lb 2 oz (68.1 kg)  12/14/21 156 lb (70.8 kg)      Physical Exam Constitutional:      General: She is not in acute distress.    Appearance: Normal appearance.  HENT:     Head: Normocephalic and atraumatic.     Right Ear: Ear canal normal. No middle ear effusion. Tympanic membrane is not erythematous.     Left Ear: Ear canal normal.  No middle ear effusion. Tympanic membrane is not erythematous.     Ears:     Comments: Bilateral TMs full.    Nose:     Right Sinus: Maxillary sinus tenderness present.     Left Sinus: Maxillary sinus tenderness present.     Mouth/Throat:     Mouth: Mucous membranes are moist.     Pharynx: Uvula midline. Postnasal drip present.     Tonsils: No tonsillar exudate.  Eyes:     Extraocular Movements: Extraocular movements intact.     Conjunctiva/sclera: Conjunctivae normal.  Cardiovascular:     Rate and Rhythm: Normal rate and regular rhythm.     Heart sounds: Normal heart sounds. No murmur heard.  No gallop.  Pulmonary:     Effort: Pulmonary effort is normal. No respiratory distress.     Breath sounds: Normal breath sounds. No wheezing, rhonchi or rales.  Skin:    General: Skin is warm and dry.  Neurological:     Mental Status: She is alert and oriented to person, place, and time.      No results found for any visits on 02/16/23.    Assessment & Plan:  Acute maxillary sinusitis, recurrence not specified -     Amoxicillin-Pot Clavulanate; Take 1 tablet by mouth in the morning and at bedtime for 7 days.  Dispense: 14 tablet; Refill: 0  Post-nasal drainage   URI symptoms x 10 days.  Start ABX for sinusitis.  OTC Flonase and other supportive care.  Given precautions for continued or worsening symptoms.  Return if symptoms worsen or fail to improve.   Deeann Saint, MD

## 2023-03-14 DIAGNOSIS — L821 Other seborrheic keratosis: Secondary | ICD-10-CM | POA: Diagnosis not present

## 2023-03-14 DIAGNOSIS — D225 Melanocytic nevi of trunk: Secondary | ICD-10-CM | POA: Diagnosis not present

## 2023-03-14 DIAGNOSIS — L814 Other melanin hyperpigmentation: Secondary | ICD-10-CM | POA: Diagnosis not present

## 2023-03-14 DIAGNOSIS — B079 Viral wart, unspecified: Secondary | ICD-10-CM | POA: Diagnosis not present

## 2023-04-10 NOTE — Progress Notes (Signed)
   ACUTE VISIT No chief complaint on file.  HPI: DorothyDorothy Johnson is a 57 y.o. female with a PMHx significant for OA, seasonal allergies, heart murmur, and migraines, who is here today complaining of headaches and nausea.  HPI  Review of Systems See other pertinent positives and negatives in HPI.  Current Outpatient Medications on File Prior to Visit  Medication Sig Dispense Refill   valACYclovir (VALTREX) 500 MG tablet as needed.     No current facility-administered medications on file prior to visit.    Past Medical History:  Diagnosis Date   ASCUS on Pap smear 02/2007   NEG HPV   Frequent headaches    Heart murmur    Osteoarthritis    Rotator cuff tear    Allergies  Allergen Reactions   Grass Extracts [Gramineae Pollens]    Latex Itching    Social History   Socioeconomic History   Marital status: Married    Spouse name: Not on file   Number of children: Not on file   Years of education: Not on file   Highest education level: Not on file  Occupational History   Not on file  Tobacco Use   Smoking status: Never   Smokeless tobacco: Never  Vaping Use   Vaping status: Never Used  Substance and Sexual Activity   Alcohol use: Yes    Comment: OCCASSIONALLY   Drug use: No   Sexual activity: Yes    Birth control/protection: Other-see comments    Comment: VASECTOMY  Other Topics Concern   Not on file  Social History Narrative   Married 2 children   Right handed   Currently in graduate school   3 cups daily   Social Determinants of Health   Financial Resource Strain: Not on file  Food Insecurity: No Food Insecurity (04/26/2022)   Received from [facility]   Hunger Vital Sign    Worried About Running Out of Food in the Last Year: Never true    Ran Out of Food in the Last Year: Never true  Transportation Needs: Not on file  Physical Activity: Not on file  Stress: Not on file  Social Connections: Unknown (04/04/2022)   Received from [facility]    Social Network    Social Network: Not on file    There were no vitals filed for this visit. There is no height or weight on file to calculate BMI.  Physical Exam  ASSESSMENT AND PLAN:  [name] was seen today for headaches and nausea.   There are no diagnoses linked to this encounter.  No follow-ups on file.  I, Suanne Marker, acting as a scribe for Gayle Martinez [place], MD., have documented all relevant documentation on the behalf of Dorothy Borromeo [place], MD, as directed by  Syon Tews [place], MD while in the presence of Janard Culp [place], MD.   I, Suanne Marker, have reviewed all documentation for this visit. The documentation on 04/10/23 for the exam, diagnosis, procedures, and orders are all accurate and complete.  Michille Mcelrath G. [place], MD  [facility]. [place] office.  Discharge Instructions   None

## 2023-04-11 ENCOUNTER — Ambulatory Visit: Payer: BC Managed Care – PPO | Admitting: Family Medicine

## 2023-04-11 ENCOUNTER — Encounter: Payer: Self-pay | Admitting: Family Medicine

## 2023-04-11 VITALS — BP 120/70 | HR 76 | Temp 98.2°F | Resp 12 | Ht 63.0 in | Wt 149.4 lb

## 2023-04-11 DIAGNOSIS — R110 Nausea: Secondary | ICD-10-CM | POA: Diagnosis not present

## 2023-04-11 DIAGNOSIS — M542 Cervicalgia: Secondary | ICD-10-CM | POA: Diagnosis not present

## 2023-04-11 DIAGNOSIS — R519 Headache, unspecified: Secondary | ICD-10-CM

## 2023-04-11 DIAGNOSIS — G8929 Other chronic pain: Secondary | ICD-10-CM | POA: Diagnosis not present

## 2023-04-11 MED ORDER — ONDANSETRON HCL 4 MG PO TABS: 4.0000 mg | ORAL_TABLET | Freq: Every day | ORAL | 0 refills | Status: AC | PRN

## 2023-04-11 MED ORDER — CYCLOBENZAPRINE HCL 10 MG PO TABS: 5.0000 mg | ORAL_TABLET | Freq: Every day | ORAL | 0 refills | Status: AC

## 2023-04-11 NOTE — Patient Instructions (Addendum)
A few things to remember from today's visit:  Headache, unspecified headache type  Nausea without vomiting  Chronic neck pain  ? Tension headache. We can try Flexeril 5-10 mg at bedtime for 3 weeks then as needed if it helps. Another options will be Amitriptyline or nortriptyline if Flexeril doe snot help. Local neck massage and range of motion exercises will also help with neck pain.  If you need refills for medications you take chronically, please call your pharmacy. Do not use My Chart to request refills or for acute issues that need immediate attention. If you send a my chart message, it may take a few days to be addressed, specially if I am not in the office.  Please be sure medication list is accurate. If a new problem present, please set up appointment sooner than planned today.

## 2023-06-01 DIAGNOSIS — M545 Low back pain, unspecified: Secondary | ICD-10-CM | POA: Diagnosis not present

## 2023-06-08 DIAGNOSIS — M545 Low back pain, unspecified: Secondary | ICD-10-CM | POA: Diagnosis not present

## 2023-06-09 DIAGNOSIS — Z1331 Encounter for screening for depression: Secondary | ICD-10-CM | POA: Diagnosis not present

## 2023-06-09 DIAGNOSIS — Z01419 Encounter for gynecological examination (general) (routine) without abnormal findings: Secondary | ICD-10-CM | POA: Diagnosis not present

## 2023-06-09 DIAGNOSIS — Z1231 Encounter for screening mammogram for malignant neoplasm of breast: Secondary | ICD-10-CM | POA: Diagnosis not present

## 2023-08-15 DIAGNOSIS — M25531 Pain in right wrist: Secondary | ICD-10-CM | POA: Diagnosis not present

## 2023-09-15 DIAGNOSIS — Z30432 Encounter for removal of intrauterine contraceptive device: Secondary | ICD-10-CM | POA: Diagnosis not present

## 2023-09-15 DIAGNOSIS — T8389XA Other specified complication of genitourinary prosthetic devices, implants and grafts, initial encounter: Secondary | ICD-10-CM | POA: Diagnosis not present

## 2024-01-21 ENCOUNTER — Other Ambulatory Visit: Payer: Self-pay | Admitting: Medical Genetics

## 2024-01-23 ENCOUNTER — Other Ambulatory Visit: Payer: Self-pay | Admitting: Medical Genetics

## 2024-01-23 ENCOUNTER — Other Ambulatory Visit

## 2024-01-23 DIAGNOSIS — Z006 Encounter for examination for normal comparison and control in clinical research program: Secondary | ICD-10-CM

## 2024-01-23 NOTE — Progress Notes (Signed)
 SABRA

## 2024-01-29 ENCOUNTER — Encounter: Payer: Self-pay | Admitting: Family Medicine

## 2024-02-02 ENCOUNTER — Other Ambulatory Visit: Payer: Self-pay | Admitting: Family Medicine

## 2024-02-02 DIAGNOSIS — Z0189 Encounter for other specified special examinations: Secondary | ICD-10-CM

## 2024-02-02 DIAGNOSIS — Z780 Asymptomatic menopausal state: Secondary | ICD-10-CM

## 2024-02-16 LAB — GENECONNECT MOLECULAR SCREEN: Genetic Analysis Overall Interpretation: NEGATIVE

## 2024-02-20 ENCOUNTER — Ambulatory Visit (INDEPENDENT_AMBULATORY_CARE_PROVIDER_SITE_OTHER)
Admission: RE | Admit: 2024-02-20 | Discharge: 2024-02-20 | Disposition: A | Discharge status: Home / Self Care | Source: Ambulatory Visit | Attending: Family Medicine | Admitting: Family Medicine

## 2024-02-20 ENCOUNTER — Encounter: Payer: Self-pay | Admitting: Radiology

## 2024-02-20 DIAGNOSIS — Z780 Asymptomatic menopausal state: Secondary | ICD-10-CM | POA: Diagnosis not present

## 2024-02-20 DIAGNOSIS — Z0189 Encounter for other specified special examinations: Secondary | ICD-10-CM

## 2024-03-16 ENCOUNTER — Ambulatory Visit: Payer: Self-pay | Admitting: Family Medicine

## 2024-03-26 DIAGNOSIS — D225 Melanocytic nevi of trunk: Secondary | ICD-10-CM | POA: Diagnosis not present

## 2024-03-26 DIAGNOSIS — L579 Skin changes due to chronic exposure to nonionizing radiation, unspecified: Secondary | ICD-10-CM | POA: Diagnosis not present

## 2024-03-26 DIAGNOSIS — L821 Other seborrheic keratosis: Secondary | ICD-10-CM | POA: Diagnosis not present

## 2024-03-26 DIAGNOSIS — L814 Other melanin hyperpigmentation: Secondary | ICD-10-CM | POA: Diagnosis not present

## 2024-03-29 ENCOUNTER — Ambulatory Visit (INDEPENDENT_AMBULATORY_CARE_PROVIDER_SITE_OTHER): Admitting: Family Medicine

## 2024-03-29 ENCOUNTER — Encounter: Payer: Self-pay | Admitting: Family Medicine

## 2024-03-29 VITALS — BP 130/82 | HR 59 | Temp 98.0°F | Resp 16 | Ht 63.0 in | Wt 160.2 lb

## 2024-03-29 DIAGNOSIS — Z23 Encounter for immunization: Secondary | ICD-10-CM | POA: Diagnosis not present

## 2024-03-29 DIAGNOSIS — Z13220 Encounter for screening for lipoid disorders: Secondary | ICD-10-CM

## 2024-03-29 DIAGNOSIS — Z131 Encounter for screening for diabetes mellitus: Secondary | ICD-10-CM | POA: Diagnosis not present

## 2024-03-29 DIAGNOSIS — Z0000 Encounter for general adult medical examination without abnormal findings: Secondary | ICD-10-CM | POA: Diagnosis not present

## 2024-03-29 DIAGNOSIS — Z111 Encounter for screening for respiratory tuberculosis: Secondary | ICD-10-CM

## 2024-03-29 NOTE — Patient Instructions (Addendum)
 A few things to remember from today's visit:  Routine general medical examination at a health care facility  PPD screening test - Plan: QuantiFERON-TB Gold Plus, QuantiFERON-TB Gold Plus, CANCELED: QuantiFERON-TB Gold Plus, CANCELED: QuantiFERON-TB Gold Plus  Screening for lipid disorders - Plan: Lipid panel, Lipid panel  Diabetes mellitus screening - Plan: Basic metabolic panel with GFR, Basic metabolic panel with GFR  If you need refills for medications you take chronically, please call your pharmacy. Do not use My Chart to request refills or for acute issues that need immediate attention. If you send a my chart message, it may take a few days to be addressed, specially if I am not in the office.  Please be sure medication list is accurate. If a new problem present, please set up appointment sooner than planned today.  Health Maintenance, Female Adopting a healthy lifestyle and getting preventive care are important in promoting health and wellness. Ask your health care provider about: The right schedule for you to have regular tests and exams. Things you can do on your own to prevent diseases and keep yourself healthy. What should I know about diet, weight, and exercise? Eat a healthy diet  Eat a diet that includes plenty of vegetables, fruits, low-fat dairy products, and lean protein. Do not eat a lot of foods that are high in solid fats, added sugars, or sodium. Maintain a healthy weight Body mass index (BMI) is used to identify weight problems. It estimates body fat based on height and weight. Your health care provider can help determine your BMI and help you achieve or maintain a healthy weight. Get regular exercise Get regular exercise. This is one of the most important things you can do for your health. Most adults should: Exercise for at least 150 minutes each week. The exercise should increase your heart rate and make you sweat (moderate-intensity exercise). Do strengthening  exercises at least twice a week. This is in addition to the moderate-intensity exercise. Spend less time sitting. Even light physical activity can be beneficial. Watch cholesterol and blood lipids Have your blood tested for lipids and cholesterol at 59 years of age, then have this test every 5 years. Have your cholesterol levels checked more often if: Your lipid or cholesterol levels are high. You are older than 60 years of age. You are at high risk for heart disease. What should I know about cancer screening? Depending on your health history and family history, you may need to have cancer screening at various ages. This may include screening for: Breast cancer. Cervical cancer. Colorectal cancer. Skin cancer. Lung cancer. What should I know about heart disease, diabetes, and high blood pressure? Blood pressure and heart disease High blood pressure causes heart disease and increases the risk of stroke. This is more likely to develop in people who have high blood pressure readings or are overweight. Have your blood pressure checked: Every 3-5 years if you are 68-23 years of age. Every year if you are 78 years old or older. Diabetes Have regular diabetes screenings. This checks your fasting blood sugar level. Have the screening done: Once every three years after age 11 if you are at a normal weight and have a low risk for diabetes. More often and at a younger age if you are overweight or have a high risk for diabetes. What should I know about preventing infection? Hepatitis B If you have a higher risk for hepatitis B, you should be screened for this virus. Talk with your health  care provider to find out if you are at risk for hepatitis B infection. Hepatitis C Testing is recommended for: Everyone born from [birth year] through 1967. Anyone with known risk factors for hepatitis C. Sexually transmitted infections (STIs) Get screened for STIs, including gonorrhea and chlamydia, if: You are  sexually active and are younger than 60 years of age. You are older than 60 years of age and your health care provider tells you that you are at risk for this type of infection. Your sexual activity has changed since you were last screened, and you are at increased risk for chlamydia or gonorrhea. Ask your health care provider if you are at risk. Ask your health care provider about whether you are at high risk for HIV. Your health care provider may recommend a prescription medicine to help prevent HIV infection. If you choose to take medicine to prevent HIV, you should first get tested for HIV. You should then be tested every 3 months for as long as you are taking the medicine. Pregnancy If you are about to stop having your period (premenopausal) and you may become pregnant, seek counseling before you get pregnant. Take 400 to 800 micrograms (mcg) of folic acid  every day if you become pregnant. Ask for birth control (contraception) if you want to prevent pregnancy. Osteoporosis and menopause Osteoporosis is a disease in which the bones lose minerals and strength with aging. This can result in bone fractures. If you are 30 years old or older, or if you are at risk for osteoporosis and fractures, ask your health care provider if you should: Be screened for bone loss. Take a calcium or vitamin D supplement to lower your risk of fractures. Be given hormone replacement therapy (HRT) to treat symptoms of menopause. Follow these instructions at home: Alcohol use Do not drink alcohol if: Your health care provider tells you not to drink. You are pregnant, may be pregnant, or are planning to become pregnant. If you drink alcohol: Limit how much you have to: 0-1 drink a day. Know how much alcohol is in your drink. In the [place] one drink equals one 12 oz bottle of beer (355 mL), one 5 oz glass of wine (148 mL), or one 1 oz glass of hard liquor (44 mL). Lifestyle Do not use any products that contain  nicotine or tobacco. These products include cigarettes, chewing tobacco, and vaping devices, such as e-cigarettes. If you need help quitting, ask your health care provider. Do not use street drugs. Do not share needles. Ask your health care provider for help if you need support or information about quitting drugs. General instructions Schedule regular health, dental, and eye exams. Stay current with your vaccines. Tell your health care provider if: You often feel depressed. You have ever been abused or do not feel safe at home. Summary Adopting a healthy lifestyle and getting preventive care are important in promoting health and wellness. Follow your health care provider's instructions about healthy diet, exercising, and getting tested or screened for diseases. Follow your health care provider's instructions on monitoring your cholesterol and blood pressure. This information is not intended to replace advice given to you by your health care provider. Make sure you discuss any questions you have with your health care provider. Document Revised: 11/02/2020 Document Reviewed: 11/02/2020 Elsevier Patient Education  2024 [employer].

## 2024-03-29 NOTE — Assessment & Plan Note (Addendum)
 Continue regular physical activity and a healthful diet, both important for CVD prevention. Preventive guidelines reviewed. Vaccination updated, Prevnar 20 give today. Health exam certification completed and signed. She will attach TB screening when result is back. Ca++ and vit D supplementation to continue. She sees her gynecologist regularly for her routine female preventive care. Next CPE in a year.

## 2024-03-29 NOTE — Progress Notes (Signed)
 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   HPI: [name] is a 58 y.o. female a PMHx significant for OA, seasonal allergies,and migraines here today for her routine physical. 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  Last CPE: > a year ago. She sees her gyn annually for her female preventive care.  She exercises regularly, 5 times per week with wt lifting, cardio,and yoga at the gym. She cooks at home most days and eats vegetables daily. Sleeps 8 hours. She sees her eye care provider and dentist regularly. Drinks alcohol weekends, wine No hx of tobacco use.  Immunization History  Administered Date(s) Administered   Influenza, Mdck, Trivalent,PF 6+ MOS(egg free) 03/20/2024   Influenza,inj,Quad PF,6+ Mos 04/12/2018, 03/06/2019, 04/11/2022   Influenza,inj,quad, With Preservative 03/06/2019   Influenza-Unspecified 03/06/2019, 02/25/2021, 03/24/2021   Moderna Sars-Covid-2 Vaccination 08/31/2019, 09/28/2019, 05/19/2020, 11/14/2020   PNEUMOCOCCAL CONJUGATE-20 03/29/2024   Td (Adult),5 Lf Tetanus Toxid, Preservative Free 06/25/2006   Tdap 11/14/2019   Zoster Recombinant(Shingrix ) 08/20/2018, 12/21/2018   Health Maintenance  Topic Date Due   Cervical Cancer Screening (HPV/Pap Cotest)  02/17/2019   Mammogram  01/26/2024   COVID-19 Vaccine (5 - 2025-26 season) 04/14/2024 (Originally 02/26/2024)   HIV Screening  01/24/2029 (Originally 11/10/1978)   DTaP/Tdap/Td (2 - Td or Tdap) 11/13/2029   Colonoscopy  01/19/2033   Pneumococcal Vaccine: 50+ Years  Completed   Influenza Vaccine  Completed   Hepatitis C Screening  Completed   Zoster Vaccines- Shingrix   Completed   Hepatitis B Vaccines 19-59 Average Risk  Aged Out   HPV VACCINES  Aged Out   Meningococcal B Vaccine  Aged Out   Lab Results  Component Value Date   CHOL 177  08/20/2018   HDL 49.90 08/20/2018   LDLCALC 103 (H) 08/20/2018   TRIG 117.0 08/20/2018   CHOLHDL 4 08/20/2018   Review of Systems  Constitutional:  Negative for activity change, appetite change and fever.  HENT:  Negative for mouth sores, sore throat and trouble swallowing.   Eyes:  Negative for redness and visual disturbance.  Respiratory:  Negative for cough, shortness of breath and wheezing.   Cardiovascular:  Negative for chest pain and leg swelling.  Gastrointestinal:  Negative for abdominal pain, nausea and vomiting.  Endocrine: Negative for cold intolerance, heat intolerance, polydipsia, polyphagia and polyuria.  Genitourinary:  Negative for decreased urine volume, dysuria and hematuria.  Musculoskeletal:  Positive for arthralgias. Negative for gait problem.  Skin:  Negative for color change and rash.  Allergic/Immunologic: Negative for environmental allergies.  Neurological:  Negative for seizures, syncope, weakness and headaches.  Hematological:  Negative for adenopathy. Does not bruise/bleed easily.  Psychiatric/Behavioral:  Negative for confusion. The patient is not nervous/anxious.   All other systems reviewed and are negative.  Current Outpatient Medications on File Prior to Visit  Medication Sig Dispense Refill   cyclobenzaprine  (FLEXERIL ) 10 MG tablet Take 0.5-1 tablets (5-10 mg total) by mouth at bedtime. 30 tablet 0   ondansetron  (ZOFRAN ) 4 MG tablet Take 1 tablet (4 mg total) by mouth daily as needed for nausea or vomiting (nausea with headaches.). 15 tablet 0   valACYclovir (VALTREX) 500 MG tablet as needed.     No current facility-administered medications on file prior to visit.   Past Medical History:  Diagnosis Date   ASCUS on Pap smear 02/2007  NEG HPV   Frequent headaches    Heart murmur    Osteoarthritis    Rotator cuff tear    Past Surgical History:  Procedure Laterality Date   CESAREAN SECTION     HYSTEROSCOPY  03/2006   WITH D&C    TONSILLECTOMY AND ADENOIDECTOMY  1978    Allergies  Allergen Reactions   Grass Extracts [Gramineae Pollens]    Latex Itching    Family History  Problem Relation Age of Onset   Arthritis Father    Hypertension Mother    Breast cancer Maternal Grandmother    Healthy Daughter    Healthy Son     Social History   Socioeconomic History   Marital status: Married    Spouse name: Not on file   Number of children: Not on file   Years of education: Not on file   Highest education level: Not on file  Occupational History   Not on file  Tobacco Use   Smoking status: Never   Smokeless tobacco: Never  Vaping Use   Vaping status: Never Used  Substance and Sexual Activity   Alcohol use: Yes    Comment: OCCASSIONALLY   Drug use: No   Sexual activity: Yes    Birth control/protection: Other-see comments    Comment: VASECTOMY  Other Topics Concern   Not on file  Social History Narrative   Married 2 children   Right handed   Currently in graduate school   3 cups daily   Social Drivers of Health   Financial Resource Strain: Not on file  Food Insecurity: No Food Insecurity (04/26/2022)   [facility]   Hunger Vital Sign    Within the past 12 months, you worried that your food would run out before you got the money to buy more.: Never true    Within the past 12 months, the food you bought just didn't last and you didn't have money to get more.: Never true  Transportation Needs: Not on file  Physical Activity: Not on file  Stress: Not on file  Social Connections: Unknown (04/04/2022)   Received from [facility]   Social Network    Social Network: Not on file    Vitals:   03/29/24 1551  BP: 130/82  Pulse: (!) 59  Resp: 16  Temp: 98 F (36.7 C)  SpO2: 97%   Body mass index is 28.38 kg/m.  Wt Readings from Last 3 Encounters:  03/29/24 160 lb 3.2 oz (72.7 kg)  04/11/23 149 lb 6 oz (67.8 kg)  02/16/23 149 lb 3.2 oz (67.7 kg)   Physical Exam Vitals  and nursing note reviewed.  Constitutional:      General: She is not in acute distress.    Appearance: She is well-developed.  HENT:     Head: Normocephalic and atraumatic.     Right Ear: Tympanic membrane, ear canal and external ear normal.     Left Ear: Tympanic membrane, ear canal and external ear normal.     Mouth/Throat:     Mouth: Mucous membranes are moist.     Pharynx: Oropharynx is clear. Uvula midline.  Eyes:     Extraocular Movements: Extraocular movements intact.     Conjunctiva/sclera: Conjunctivae normal.     Pupils: Pupils are equal, round, and reactive to light.  Neck:     Thyroid : No thyroid  mass or thyromegaly.  Cardiovascular:     Rate and Rhythm: Regular rhythm. Bradycardia present.     Pulses:  Dorsalis pedis pulses are 2+ on the right side and 2+ on the left side.     Heart sounds: No murmur heard. Pulmonary:     Effort: Pulmonary effort is normal. No respiratory distress.     Breath sounds: Normal breath sounds.  Abdominal:     Palpations: Abdomen is soft. There is no hepatomegaly or mass.     Tenderness: There is no abdominal tenderness.  Genitourinary:    Comments: Deferred to gyn. Musculoskeletal:     Comments: No signs of synovitis appreciated.  Lymphadenopathy:     Cervical: No cervical adenopathy.     Upper Body:     Right upper body: No supraclavicular adenopathy.     Left upper body: No supraclavicular adenopathy.  Skin:    General: Skin is warm.     Findings: No erythema or rash.  Neurological:     General: No focal deficit present.     Mental Status: She is alert and oriented to person, place, and time.     Cranial Nerves: No cranial nerve deficit.     Coordination: Coordination normal.     Gait: Gait normal.     Deep Tendon Reflexes:     Reflex Scores:      Bicep reflexes are 2+ on the right side and 2+ on the left side.      Patellar reflexes are 2+ on the right side and 2+ on the left side. Psychiatric:        Mood and  Affect: Mood and affect normal.    ASSESSMENT AND PLAN: [name] was here today annual physical examination.  Orders Placed This Encounter  Procedures   Pneumococcal conjugate vaccine 20-valent (Prevnar 20)   Lipid panel   Basic metabolic panel with GFR   QuantiFERON-TB Gold Plus   Lab Results  Component Value Date   CHOL 177 03/29/2024   HDL 54 03/29/2024   LDLCALC 105 (H) 03/29/2024   TRIG 98 03/29/2024   CHOLHDL 3.3 03/29/2024   Lab Results  Component Value Date   NA 141 03/29/2024   CL 104 03/29/2024   K 4.5 03/29/2024   CO2 24 03/29/2024   BUN 14 03/29/2024   CREATININE 0.71 03/29/2024   EGFR 97 03/29/2024   CALCIUM 9.3 03/29/2024   ALBUMIN 4.4 08/20/2018   GLUCOSE 94 03/29/2024   Routine general medical examination at a health care facility Assessment & Plan: Continue regular physical activity and a healthful diet, both important for CVD prevention. Preventive guidelines reviewed. Vaccination updated, Prevnar 20 give today. Health exam certification completed and signed. She will attach TB screening when result is back. Ca++ and vit D supplementation to continue. She sees her gynecologist regularly for her routine female preventive care. Next CPE in a year.   PPD screening test -     QuantiFERON-TB Gold Plus; Future  Screening for lipid disorders -     Lipid panel; Future  Diabetes mellitus screening -     Basic metabolic panel with GFR; Future  Need for pneumococcal 20-valent conjugate vaccination -     Pneumococcal conjugate vaccine 20-valent   Return in 1 year (on 03/29/2025) for CPE.  Wylan Gentzler G. [place], MD  [facility]. [place] office.

## 2024-03-29 NOTE — Progress Notes (Signed)
 Patient is in office today for a nurse visit for Prevnar 20 Immunization. Patient Injection was given in the  Left deltoid. Patient tolerated injection well.

## 2024-03-30 ENCOUNTER — Ambulatory Visit: Payer: Self-pay | Admitting: Family Medicine

## 2024-03-30 LAB — BASIC METABOLIC PANEL WITH GFR
BUN/Creatinine Ratio: 20 (ref 12–28)
BUN: 14 mg/dL (ref 8–27)
CO2: 24 mmol/L (ref 20–29)
Calcium: 9.3 mg/dL (ref 8.7–10.3)
Chloride: 104 mmol/L (ref 96–106)
Creatinine, Ser: 0.71 mg/dL (ref 0.57–1.00)
Glucose: 94 mg/dL (ref 70–99)
Potassium: 4.5 mmol/L (ref 3.5–5.2)
Sodium: 141 mmol/L (ref 134–144)
eGFR: 97 mL/min/1.73 (ref >59)

## 2024-03-30 LAB — LIPID PANEL
Chol/HDL Ratio: 3.3 ratio (ref 0.0–4.4)
Cholesterol, Total: 177 mg/dL (ref 100–199)
HDL: 54 mg/dL (ref >39)
LDL Chol Calc (NIH): 105 mg/dL — ABNORMAL HIGH (ref 0–99)
Triglycerides: 98 mg/dL (ref 0–149)
VLDL Cholesterol Cal: 18 mg/dL (ref 5–40)

## 2024-04-02 LAB — QUANTIFERON-TB GOLD PLUS
Mitogen-NIL: >10 [IU]/mL
NIL: 0.01 [IU]/mL
QuantiFERON-TB Gold Plus: NEGATIVE
TB1-NIL: 0 [IU]/mL
TB2-NIL: 0 [IU]/mL

## 2024-04-08 DIAGNOSIS — H93293 Other abnormal auditory perceptions, bilateral: Secondary | ICD-10-CM | POA: Diagnosis not present

## 2024-04-28 ENCOUNTER — Encounter: Payer: Self-pay | Admitting: Family Medicine

## 2024-04-29 NOTE — Telephone Encounter (Signed)
 Spoke with patient. Advised patient Dr. Jordan is not in office today. Explained to patient with symptoms as she had it is best to be seen in the Emergency Department. Patient states she is feeling better today. No pain or nausea. Advised patient will schedule an appointment for tomorrow with another provider due to Dr. Jordan not having any available appointments. Patient scheduled with Dr. Johnny. Advised patient if symptoms return or get worse to seek medical attention in the Emergency Department. Patient voiced understanding.

## 2024-04-30 ENCOUNTER — Ambulatory Visit (INDEPENDENT_AMBULATORY_CARE_PROVIDER_SITE_OTHER): Admitting: Family Medicine

## 2024-04-30 ENCOUNTER — Encounter: Payer: Self-pay | Admitting: Family Medicine

## 2024-04-30 VITALS — BP 124/90 | HR 56 | Temp 98.0°F | Wt 157.4 lb

## 2024-04-30 DIAGNOSIS — R079 Chest pain, unspecified: Secondary | ICD-10-CM

## 2024-04-30 NOTE — Progress Notes (Signed)
   Subjective:    Patient ID: [name], female    DOB: [birth year], 58 y.o.   MRN: [id number]  HPI Here with her husband for an episode of chest pain that occurred 2 days ago. On that day she had eaten dinner, and then the two of them were walking from their car to a theater to see a play. As she was walking she developed a dull pain in the left chest area that radiated to the left shoulder and up into the left neck. She says the pain felt better if she applied pressure to the left chest. There was no SOB. She felt a little nausea but did not vomit. This lasted about 5 minutes, and then it all stopped. She has felt fine since then except for some generalized fatigue. She has never had heartburn or indigestion. She is active, she works out at a gym with [employer] and she does yoga. She has never felt this type of pain before. She notes that she has had intermittent pain in the left shoulder due to a rotator cuff problem.    Review of Systems  Constitutional:  Positive for fatigue.  Respiratory: Negative.    Cardiovascular:  Positive for chest pain. Negative for palpitations and leg swelling.  Gastrointestinal: Negative.        Objective:   Physical Exam Constitutional:      General: She is not in acute distress.    Appearance: Normal appearance.  Cardiovascular:     Rate and Rhythm: Normal rate and regular rhythm.     Pulses: Normal pulses.     Heart sounds: Normal heart sounds.     Comments: EKG today is normal  Pulmonary:     Effort: Pulmonary effort is normal.     Breath sounds: Normal breath sounds.     Comments: She is tender over the left lateral chest at the anterior axillary line. This exactly recreates the type of pain that she felt. She is also tender in the anterior left shoulder. The shoulder has full ROM  Neurological:     Mental Status: She is alert.           Assessment & Plan:  She had an episode of chest pain that was most likely musculoskeletal in nature. I  asked her to avoid any exercising for one week to let things calm down. She will follow up as needed. I personally spent a total of 32 minutes in the care of the patient today including getting/reviewing separately obtained history, performing a medically appropriate exam/evaluation, placing orders, and communicating results.  Garnette Olmsted, MD
